# Patient Record
Sex: Female | Born: 1960 | Race: White | Hispanic: No | Marital: Married | Smoking: Former smoker
Health system: Southern US, Community
[De-identification: ages and names within clinical notes are randomized; demographics above are authoritative.]

## PROBLEM LIST (undated history)

## (undated) DIAGNOSIS — F419 Anxiety disorder, unspecified: Secondary | ICD-10-CM

## (undated) DIAGNOSIS — M797 Fibromyalgia: Secondary | ICD-10-CM

## (undated) DIAGNOSIS — I700 Atherosclerosis of aorta: Secondary | ICD-10-CM

## (undated) DIAGNOSIS — E785 Hyperlipidemia, unspecified: Secondary | ICD-10-CM

## (undated) DIAGNOSIS — M1990 Unspecified osteoarthritis, unspecified site: Secondary | ICD-10-CM

## (undated) DIAGNOSIS — K219 Gastro-esophageal reflux disease without esophagitis: Secondary | ICD-10-CM

## (undated) DIAGNOSIS — K589 Irritable bowel syndrome without diarrhea: Secondary | ICD-10-CM

## (undated) DIAGNOSIS — J439 Emphysema, unspecified: Secondary | ICD-10-CM

## (undated) DIAGNOSIS — G4733 Obstructive sleep apnea (adult) (pediatric): Secondary | ICD-10-CM

## (undated) DIAGNOSIS — N3010 Interstitial cystitis (chronic) without hematuria: Secondary | ICD-10-CM

## (undated) DIAGNOSIS — C801 Malignant (primary) neoplasm, unspecified: Secondary | ICD-10-CM

## (undated) DIAGNOSIS — F32A Depression, unspecified: Secondary | ICD-10-CM

## (undated) DIAGNOSIS — N809 Endometriosis, unspecified: Secondary | ICD-10-CM

## (undated) DIAGNOSIS — I10 Essential (primary) hypertension: Secondary | ICD-10-CM

## (undated) DIAGNOSIS — M5090 Cervical disc disorder, unspecified, unspecified cervical region: Secondary | ICD-10-CM

## (undated) HISTORY — PX: TONSILLECTOMY: SUR1361

## (undated) HISTORY — PX: ABDOMINOPLASTY: SUR9

## (undated) HISTORY — PX: OOPHORECTOMY: SHX86

## (undated) HISTORY — PX: ABDOMINAL HYSTERECTOMY: SHX81

## (undated) HISTORY — PX: CERVICAL FUSION: SHX112

## (undated) HISTORY — PX: CHOLECYSTECTOMY: SHX55

## (undated) HISTORY — PX: LYSIS OF ADHESION: SHX5961

## (undated) HISTORY — PX: CARPAL TUNNEL RELEASE: SHX101

---
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
## 2004-02-14 ENCOUNTER — Encounter: Admission: RE | Admit: 2004-02-14 | Discharge: 2004-02-14 | Payer: Self-pay | Admitting: Neurosurgery

## 2004-08-13 ENCOUNTER — Ambulatory Visit: Payer: Self-pay | Admitting: Pain Medicine

## 2004-09-10 ENCOUNTER — Ambulatory Visit: Payer: Self-pay | Admitting: Family Medicine

## 2004-09-23 ENCOUNTER — Ambulatory Visit: Payer: Self-pay | Admitting: Pain Medicine

## 2004-10-27 ENCOUNTER — Ambulatory Visit: Payer: Self-pay | Admitting: Pain Medicine

## 2004-12-23 ENCOUNTER — Ambulatory Visit: Payer: Self-pay | Admitting: Pain Medicine

## 2005-02-17 ENCOUNTER — Ambulatory Visit: Payer: Self-pay | Admitting: Pain Medicine

## 2005-02-25 ENCOUNTER — Ambulatory Visit: Payer: Self-pay | Admitting: Pain Medicine

## 2005-03-26 ENCOUNTER — Ambulatory Visit: Payer: Self-pay | Admitting: Pain Medicine

## 2005-05-06 ENCOUNTER — Ambulatory Visit: Payer: Self-pay | Admitting: Pain Medicine

## 2005-05-07 ENCOUNTER — Ambulatory Visit: Payer: Self-pay | Admitting: Family Medicine

## 2005-10-22 ENCOUNTER — Ambulatory Visit: Payer: Self-pay | Admitting: Pain Medicine

## 2005-10-26 ENCOUNTER — Ambulatory Visit: Payer: Self-pay | Admitting: Urology

## 2005-11-02 ENCOUNTER — Emergency Department: Payer: Self-pay | Admitting: Unknown Physician Specialty

## 2005-11-04 ENCOUNTER — Ambulatory Visit: Payer: Self-pay | Admitting: Pain Medicine

## 2005-12-10 ENCOUNTER — Ambulatory Visit: Payer: Self-pay | Admitting: Pain Medicine

## 2006-01-19 ENCOUNTER — Ambulatory Visit: Payer: Self-pay | Admitting: Pain Medicine

## 2006-02-23 ENCOUNTER — Ambulatory Visit: Payer: Self-pay | Admitting: Pain Medicine

## 2006-04-22 ENCOUNTER — Ambulatory Visit: Payer: Self-pay | Admitting: Pain Medicine

## 2006-05-05 ENCOUNTER — Ambulatory Visit: Payer: Self-pay | Admitting: Pain Medicine

## 2006-05-17 ENCOUNTER — Ambulatory Visit: Payer: Self-pay | Admitting: Family Medicine

## 2006-06-08 ENCOUNTER — Ambulatory Visit: Payer: Self-pay | Admitting: Pain Medicine

## 2006-08-24 ENCOUNTER — Ambulatory Visit: Payer: Self-pay | Admitting: Pain Medicine

## 2006-11-16 ENCOUNTER — Ambulatory Visit: Payer: Self-pay | Admitting: Pain Medicine

## 2006-12-01 ENCOUNTER — Ambulatory Visit: Payer: Self-pay | Admitting: Pain Medicine

## 2007-01-06 ENCOUNTER — Ambulatory Visit: Payer: Self-pay | Admitting: Pain Medicine

## 2007-05-03 ENCOUNTER — Ambulatory Visit: Payer: Self-pay | Admitting: Pain Medicine

## 2007-05-18 ENCOUNTER — Ambulatory Visit: Payer: Self-pay | Admitting: Pain Medicine

## 2007-06-29 ENCOUNTER — Ambulatory Visit: Payer: Self-pay | Admitting: Pain Medicine

## 2007-08-02 ENCOUNTER — Ambulatory Visit: Payer: Self-pay | Admitting: Pain Medicine

## 2007-08-10 ENCOUNTER — Ambulatory Visit: Payer: Self-pay | Admitting: Pain Medicine

## 2007-09-01 ENCOUNTER — Ambulatory Visit: Payer: Self-pay | Admitting: Family Medicine

## 2007-09-06 ENCOUNTER — Ambulatory Visit: Payer: Self-pay | Admitting: Pain Medicine

## 2007-10-17 ENCOUNTER — Ambulatory Visit: Payer: Self-pay | Admitting: Pain Medicine

## 2007-11-29 ENCOUNTER — Ambulatory Visit: Payer: Self-pay | Admitting: Pain Medicine

## 2008-05-08 ENCOUNTER — Ambulatory Visit: Payer: Self-pay | Admitting: Pain Medicine

## 2008-05-23 ENCOUNTER — Ambulatory Visit: Payer: Self-pay | Admitting: Pain Medicine

## 2008-06-26 ENCOUNTER — Ambulatory Visit: Payer: Self-pay | Admitting: Pain Medicine

## 2008-09-03 ENCOUNTER — Ambulatory Visit: Payer: Self-pay | Admitting: Family Medicine

## 2008-09-04 ENCOUNTER — Ambulatory Visit: Payer: Self-pay | Admitting: Pain Medicine

## 2008-10-11 ENCOUNTER — Ambulatory Visit: Payer: Self-pay | Admitting: Surgery

## 2008-10-30 ENCOUNTER — Ambulatory Visit: Payer: Self-pay | Admitting: Pain Medicine

## 2008-12-04 ENCOUNTER — Ambulatory Visit: Payer: Self-pay | Admitting: Pain Medicine

## 2009-01-01 ENCOUNTER — Ambulatory Visit: Payer: Self-pay | Admitting: Pain Medicine

## 2009-01-29 ENCOUNTER — Ambulatory Visit: Payer: Self-pay | Admitting: Pain Medicine

## 2009-03-05 ENCOUNTER — Ambulatory Visit: Payer: Self-pay | Admitting: Pain Medicine

## 2009-03-11 ENCOUNTER — Ambulatory Visit: Payer: Self-pay | Admitting: Pain Medicine

## 2009-04-02 ENCOUNTER — Ambulatory Visit: Payer: Self-pay | Admitting: Pain Medicine

## 2009-05-02 ENCOUNTER — Ambulatory Visit: Payer: Self-pay | Admitting: Pain Medicine

## 2009-06-05 ENCOUNTER — Ambulatory Visit: Payer: Self-pay | Admitting: Pain Medicine

## 2009-07-04 ENCOUNTER — Ambulatory Visit: Payer: Self-pay | Admitting: Pain Medicine

## 2009-07-31 ENCOUNTER — Ambulatory Visit: Payer: Self-pay | Admitting: Pain Medicine

## 2009-08-05 ENCOUNTER — Ambulatory Visit: Payer: Self-pay | Admitting: Pain Medicine

## 2009-09-16 ENCOUNTER — Ambulatory Visit: Payer: Self-pay | Admitting: Family Medicine

## 2009-09-23 ENCOUNTER — Ambulatory Visit: Payer: Self-pay | Admitting: Pain Medicine

## 2009-10-22 ENCOUNTER — Ambulatory Visit: Payer: Self-pay | Admitting: Pain Medicine

## 2009-11-20 ENCOUNTER — Ambulatory Visit: Payer: Self-pay | Admitting: Pain Medicine

## 2010-05-06 ENCOUNTER — Ambulatory Visit: Payer: Self-pay | Admitting: Pain Medicine

## 2010-06-03 ENCOUNTER — Ambulatory Visit: Payer: Self-pay | Admitting: Pain Medicine

## 2010-07-03 ENCOUNTER — Ambulatory Visit: Payer: Self-pay | Admitting: Pain Medicine

## 2010-08-04 ENCOUNTER — Ambulatory Visit: Payer: Self-pay | Admitting: Pain Medicine

## 2010-09-25 ENCOUNTER — Ambulatory Visit: Payer: Self-pay | Admitting: Pain Medicine

## 2010-10-30 ENCOUNTER — Ambulatory Visit: Payer: Self-pay | Admitting: Pain Medicine

## 2010-11-18 ENCOUNTER — Ambulatory Visit: Payer: Self-pay | Admitting: Pain Medicine

## 2010-12-02 ENCOUNTER — Ambulatory Visit: Payer: Self-pay | Admitting: Family Medicine

## 2010-12-04 ENCOUNTER — Ambulatory Visit: Payer: Self-pay | Admitting: Family Medicine

## 2010-12-16 ENCOUNTER — Ambulatory Visit: Payer: Self-pay | Admitting: Pain Medicine

## 2011-01-13 ENCOUNTER — Ambulatory Visit: Payer: Self-pay | Admitting: Pain Medicine

## 2011-02-11 ENCOUNTER — Ambulatory Visit: Payer: Self-pay | Admitting: Pain Medicine

## 2011-03-10 ENCOUNTER — Ambulatory Visit: Payer: Self-pay | Admitting: Pain Medicine

## 2011-04-14 ENCOUNTER — Ambulatory Visit: Payer: Self-pay | Admitting: Pain Medicine

## 2011-04-20 ENCOUNTER — Ambulatory Visit: Payer: Self-pay | Admitting: Pain Medicine

## 2011-05-06 ENCOUNTER — Ambulatory Visit: Payer: Self-pay | Admitting: Pain Medicine

## 2011-06-03 ENCOUNTER — Ambulatory Visit: Payer: Self-pay | Admitting: Pain Medicine

## 2011-06-24 ENCOUNTER — Ambulatory Visit: Payer: Self-pay | Admitting: Pain Medicine

## 2011-07-01 ENCOUNTER — Ambulatory Visit: Payer: Self-pay | Admitting: Pain Medicine

## 2011-08-04 ENCOUNTER — Ambulatory Visit: Payer: Self-pay | Admitting: Pain Medicine

## 2011-08-12 ENCOUNTER — Ambulatory Visit: Payer: Self-pay | Admitting: Pain Medicine

## 2011-09-08 ENCOUNTER — Ambulatory Visit: Payer: Self-pay | Admitting: Pain Medicine

## 2011-10-06 ENCOUNTER — Ambulatory Visit: Payer: Self-pay | Admitting: Pain Medicine

## 2011-10-27 ENCOUNTER — Ambulatory Visit: Payer: Self-pay | Admitting: Pain Medicine

## 2011-12-01 ENCOUNTER — Ambulatory Visit: Payer: Self-pay | Admitting: Pain Medicine

## 2011-12-07 ENCOUNTER — Ambulatory Visit: Payer: Self-pay | Admitting: Pain Medicine

## 2012-01-21 ENCOUNTER — Ambulatory Visit: Payer: Self-pay | Admitting: Pain Medicine

## 2012-03-01 ENCOUNTER — Ambulatory Visit: Payer: Self-pay | Admitting: Pain Medicine

## 2012-03-15 ENCOUNTER — Ambulatory Visit: Payer: Self-pay | Admitting: Family Medicine

## 2012-03-31 ENCOUNTER — Ambulatory Visit: Payer: Self-pay | Admitting: Pain Medicine

## 2012-04-28 ENCOUNTER — Ambulatory Visit: Payer: Self-pay | Admitting: Pain Medicine

## 2012-05-23 ENCOUNTER — Ambulatory Visit: Payer: Self-pay | Admitting: Pain Medicine

## 2012-06-28 ENCOUNTER — Ambulatory Visit: Payer: Self-pay | Admitting: Pain Medicine

## 2012-07-06 ENCOUNTER — Ambulatory Visit: Payer: Self-pay | Admitting: Pain Medicine

## 2012-07-28 ENCOUNTER — Ambulatory Visit: Payer: Self-pay | Admitting: Pain Medicine

## 2012-08-30 ENCOUNTER — Ambulatory Visit: Payer: Self-pay | Admitting: Pain Medicine

## 2012-09-29 ENCOUNTER — Ambulatory Visit: Payer: Self-pay | Admitting: Pain Medicine

## 2012-11-08 ENCOUNTER — Ambulatory Visit: Payer: Self-pay | Admitting: Pain Medicine

## 2013-01-03 ENCOUNTER — Ambulatory Visit: Payer: Self-pay | Admitting: Pain Medicine

## 2013-01-31 ENCOUNTER — Ambulatory Visit: Payer: Self-pay | Admitting: Pain Medicine

## 2013-02-13 ENCOUNTER — Ambulatory Visit: Payer: Self-pay | Admitting: Pain Medicine

## 2013-03-16 ENCOUNTER — Ambulatory Visit: Payer: Self-pay | Admitting: Family Medicine

## 2013-04-04 ENCOUNTER — Ambulatory Visit: Payer: Self-pay | Admitting: Pain Medicine

## 2013-04-12 ENCOUNTER — Ambulatory Visit: Payer: Self-pay | Admitting: Pain Medicine

## 2013-05-02 ENCOUNTER — Ambulatory Visit: Payer: Self-pay | Admitting: Pain Medicine

## 2013-06-01 ENCOUNTER — Ambulatory Visit: Payer: Self-pay | Admitting: Pain Medicine

## 2013-06-08 ENCOUNTER — Ambulatory Visit: Payer: Self-pay | Admitting: Pain Medicine

## 2013-06-29 ENCOUNTER — Ambulatory Visit: Payer: Self-pay | Admitting: Pain Medicine

## 2013-08-07 ENCOUNTER — Ambulatory Visit: Payer: Self-pay | Admitting: Pain Medicine

## 2013-09-05 ENCOUNTER — Ambulatory Visit: Payer: Self-pay | Admitting: Pain Medicine

## 2013-10-03 ENCOUNTER — Ambulatory Visit: Payer: Self-pay | Admitting: Pain Medicine

## 2013-11-07 ENCOUNTER — Ambulatory Visit: Payer: Self-pay | Admitting: Pain Medicine

## 2013-12-14 ENCOUNTER — Ambulatory Visit: Payer: Self-pay | Admitting: Pain Medicine

## 2014-01-08 ENCOUNTER — Ambulatory Visit: Payer: Self-pay | Admitting: Unknown Physician Specialty

## 2014-01-08 HISTORY — PX: COLONOSCOPY: SHX174

## 2014-01-10 LAB — PATHOLOGY REPORT

## 2014-01-16 ENCOUNTER — Ambulatory Visit: Payer: Self-pay | Admitting: Pain Medicine

## 2014-04-26 ENCOUNTER — Ambulatory Visit: Payer: Self-pay | Admitting: Family Medicine

## 2014-05-03 ENCOUNTER — Ambulatory Visit: Payer: Self-pay | Admitting: Pain Medicine

## 2016-06-03 ENCOUNTER — Ambulatory Visit: Payer: Medicare Other | Attending: Internal Medicine

## 2016-06-03 DIAGNOSIS — G4733 Obstructive sleep apnea (adult) (pediatric): Secondary | ICD-10-CM | POA: Diagnosis not present

## 2016-07-01 ENCOUNTER — Other Ambulatory Visit: Payer: Self-pay | Admitting: Family Medicine

## 2016-07-01 DIAGNOSIS — Z1231 Encounter for screening mammogram for malignant neoplasm of breast: Secondary | ICD-10-CM

## 2016-07-28 ENCOUNTER — Ambulatory Visit: Admission: RE | Admit: 2016-07-28 | Payer: Self-pay | Source: Ambulatory Visit

## 2016-07-28 ENCOUNTER — Other Ambulatory Visit: Payer: Self-pay | Admitting: Family Medicine

## 2016-07-28 DIAGNOSIS — R140 Abdominal distension (gaseous): Secondary | ICD-10-CM

## 2016-07-30 ENCOUNTER — Ambulatory Visit: Admission: RE | Admit: 2016-07-30 | Payer: Medicare Other | Source: Ambulatory Visit

## 2016-08-13 ENCOUNTER — Other Ambulatory Visit: Payer: Self-pay | Admitting: Nurse Practitioner

## 2016-08-13 DIAGNOSIS — K591 Functional diarrhea: Secondary | ICD-10-CM

## 2016-08-13 DIAGNOSIS — R140 Abdominal distension (gaseous): Secondary | ICD-10-CM

## 2016-08-14 ENCOUNTER — Other Ambulatory Visit
Admission: RE | Admit: 2016-08-14 | Discharge: 2016-08-14 | Disposition: A | Discharge status: Home / Self Care | Payer: Medicare Other | Source: Ambulatory Visit | Attending: Nurse Practitioner | Admitting: Nurse Practitioner

## 2016-08-14 DIAGNOSIS — K591 Functional diarrhea: Secondary | ICD-10-CM | POA: Diagnosis present

## 2016-08-14 LAB — GASTROINTESTINAL PANEL BY PCR, STOOL (REPLACES STOOL CULTURE)

## 2016-08-14 LAB — C DIFFICILE QUICK SCREEN W PCR REFLEX
C Diff antigen: NEGATIVE
C Diff interpretation: NOT DETECTED
C Diff toxin: NEGATIVE

## 2016-08-17 LAB — PANCREATIC ELASTASE, FECAL

## 2016-08-18 ENCOUNTER — Other Ambulatory Visit: Payer: Self-pay | Admitting: Family Medicine

## 2016-08-18 ENCOUNTER — Ambulatory Visit
Admission: RE | Admit: 2016-08-18 | Discharge: 2016-08-18 | Disposition: A | Discharge status: Home / Self Care | Payer: Medicare Other | Source: Ambulatory Visit | Attending: Family Medicine | Admitting: Family Medicine

## 2016-08-18 DIAGNOSIS — Z1231 Encounter for screening mammogram for malignant neoplasm of breast: Secondary | ICD-10-CM

## 2016-08-18 HISTORY — DX: Malignant (primary) neoplasm, unspecified: C80.1

## 2016-08-19 ENCOUNTER — Ambulatory Visit: Payer: Medicare Other

## 2017-09-20 ENCOUNTER — Other Ambulatory Visit: Payer: Self-pay | Admitting: Family Medicine

## 2017-09-20 DIAGNOSIS — Z1231 Encounter for screening mammogram for malignant neoplasm of breast: Secondary | ICD-10-CM

## 2017-10-12 ENCOUNTER — Ambulatory Visit
Admission: RE | Admit: 2017-10-12 | Discharge: 2017-10-12 | Disposition: A | Discharge status: Home / Self Care | Payer: Medicare Other | Source: Ambulatory Visit | Attending: Family Medicine | Admitting: Family Medicine

## 2017-10-12 DIAGNOSIS — Z1231 Encounter for screening mammogram for malignant neoplasm of breast: Secondary | ICD-10-CM | POA: Insufficient documentation

## 2017-12-13 HISTORY — PX: CARPAL TUNNEL RELEASE: SHX101

## 2018-04-25 DIAGNOSIS — C4491 Basal cell carcinoma of skin, unspecified: Secondary | ICD-10-CM

## 2018-04-25 HISTORY — DX: Basal cell carcinoma of skin, unspecified: C44.91

## 2018-06-15 ENCOUNTER — Other Ambulatory Visit
Admission: RE | Admit: 2018-06-15 | Discharge: 2018-06-15 | Disposition: A | Discharge status: Home / Self Care | Payer: Medicare Other | Source: Other Acute Inpatient Hospital | Attending: Nurse Practitioner | Admitting: Nurse Practitioner

## 2018-06-15 DIAGNOSIS — R197 Diarrhea, unspecified: Secondary | ICD-10-CM | POA: Insufficient documentation

## 2018-06-15 LAB — GASTROINTESTINAL PANEL BY PCR, STOOL (REPLACES STOOL CULTURE)

## 2018-06-15 LAB — LACTOFERRIN, FECAL, QUALITATIVE: Lactoferrin, Fecal, Qual: NEGATIVE

## 2018-06-15 LAB — C DIFFICILE QUICK SCREEN W PCR REFLEX
C Diff antigen: NEGATIVE
C Diff interpretation: NOT DETECTED
C Diff toxin: NEGATIVE

## 2018-09-27 ENCOUNTER — Other Ambulatory Visit: Payer: Self-pay | Admitting: Family Medicine

## 2018-09-27 DIAGNOSIS — Z1231 Encounter for screening mammogram for malignant neoplasm of breast: Secondary | ICD-10-CM

## 2018-10-18 ENCOUNTER — Ambulatory Visit
Admission: RE | Admit: 2018-10-18 | Discharge: 2018-10-18 | Disposition: A | Discharge status: Home / Self Care | Payer: Medicare Other | Source: Ambulatory Visit | Attending: Family Medicine | Admitting: Family Medicine

## 2018-10-18 DIAGNOSIS — Z1231 Encounter for screening mammogram for malignant neoplasm of breast: Secondary | ICD-10-CM

## 2019-11-15 HISTORY — PX: CARPAL TUNNEL RELEASE: SHX101

## 2019-11-30 ENCOUNTER — Other Ambulatory Visit: Payer: Self-pay | Admitting: Family Medicine

## 2019-11-30 DIAGNOSIS — Z1231 Encounter for screening mammogram for malignant neoplasm of breast: Secondary | ICD-10-CM

## 2019-12-07 ENCOUNTER — Other Ambulatory Visit: Payer: Self-pay

## 2019-12-07 ENCOUNTER — Ambulatory Visit
Admission: RE | Admit: 2019-12-07 | Discharge: 2019-12-07 | Disposition: A | Discharge status: Home / Self Care | Payer: Medicare Other | Source: Ambulatory Visit | Attending: Family Medicine | Admitting: Family Medicine

## 2019-12-07 DIAGNOSIS — Z1231 Encounter for screening mammogram for malignant neoplasm of breast: Secondary | ICD-10-CM | POA: Diagnosis not present

## 2020-02-26 ENCOUNTER — Telehealth: Payer: Self-pay | Admitting: *Deleted

## 2020-02-26 NOTE — Telephone Encounter (Signed)
Received referral for low dose lung cancer screening CT scan. Message left at phone number listed in EMR for patient to call me back to facilitate scheduling scan.  

## 2020-03-01 ENCOUNTER — Telehealth: Payer: Self-pay | Admitting: *Deleted

## 2020-03-01 DIAGNOSIS — Z87891 Personal history of nicotine dependence: Secondary | ICD-10-CM

## 2020-03-01 NOTE — Telephone Encounter (Signed)
Received referral for initial lung cancer screening scan. Contacted patient and obtained smoking history,(30) as well as answering questions related to screening process. Patient denies signs of lung cancer such as weight loss or hemoptysis. Patient denies comorbidity that would prevent curative treatment if lung cancer were found. Patient is scheduled for shared decision making visit and CT scan

## 2020-03-05 ENCOUNTER — Encounter: Payer: Self-pay | Admitting: Oncology

## 2020-03-05 ENCOUNTER — Inpatient Hospital Stay: Payer: Medicare Other | Attending: Oncology | Admitting: Oncology

## 2020-03-05 DIAGNOSIS — Z122 Encounter for screening for malignant neoplasm of respiratory organs: Secondary | ICD-10-CM

## 2020-03-05 DIAGNOSIS — Z87891 Personal history of nicotine dependence: Secondary | ICD-10-CM

## 2020-03-05 NOTE — Progress Notes (Signed)
Virtual Visit via Video Note  I connected with Alice Le on 03/05/20 at  1:00 PM EDT by a video enabled telemedicine application and verified that I am speaking with the correct person using two identifiers.  Location: Patient: Home Provider: Office   I discussed the limitations of evaluation and management by telemedicine and the availability of in person appointments. The patient expressed understanding and agreed to proceed.  I discussed the assessment and treatment plan with the patient. The patient was provided an opportunity to ask questions and all were answered. The patient agreed with the plan and demonstrated an understanding of the instructions.   The patient was advised to call back or seek an in-person evaluation if the symptoms worsen or if the condition fails to improve as anticipated.   In accordance with CMS guidelines, patient has met eligibility criteria including age, absence of signs or symptoms of lung cancer.  Social History   Tobacco Use  . Smoking status: Current Every Day Smoker    Packs/day: 0.75    Years: 40.00    Pack years: 30.00    Types: Cigarettes  Substance Use Topics  . Alcohol use: Not on file  . Drug use: Not on file      A shared decision-making session was conducted prior to the performance of CT scan. This includes one or more decision aids, includes benefits and harms of screening, follow-up diagnostic testing, over-diagnosis, false positive rate, and total radiation exposure.   Counseling on the importance of adherence to annual lung cancer LDCT screening, impact of co-morbidities, and ability or willingness to undergo diagnosis and treatment is imperative for compliance of the program.   Counseling on the importance of continued smoking cessation for former smokers; the importance of smoking cessation for current smokers, and information about tobacco cessation interventions have been given to patient including [place] and  [employer] quit [place] programs.   Written order for lung cancer screening with LDCT has been given to the patient and any and all questions have been answered to the best of my abilities.    Yearly follow up will be coordinated by Burgess Estelle, Thoracic Navigator.  I provided 15 minutes of face-to-face video visit time during this encounter, and > 50% was spent counseling as documented under my assessment & plan.   Jacquelin Hawking, NP

## 2020-03-06 ENCOUNTER — Telehealth: Payer: Medicare Other | Admitting: Oncology

## 2020-03-06 ENCOUNTER — Ambulatory Visit
Admission: RE | Admit: 2020-03-06 | Discharge: 2020-03-06 | Disposition: A | Discharge status: Home / Self Care | Payer: Medicare Other | Source: Ambulatory Visit | Attending: Oncology | Admitting: Oncology

## 2020-03-06 ENCOUNTER — Other Ambulatory Visit: Payer: Self-pay

## 2020-03-06 DIAGNOSIS — Z87891 Personal history of nicotine dependence: Secondary | ICD-10-CM | POA: Insufficient documentation

## 2020-03-08 ENCOUNTER — Encounter: Payer: Self-pay | Admitting: *Deleted

## 2020-05-01 ENCOUNTER — Encounter: Payer: Self-pay | Admitting: Dermatology

## 2020-05-01 ENCOUNTER — Ambulatory Visit: Payer: Medicare Other | Admitting: Dermatology

## 2020-05-01 ENCOUNTER — Other Ambulatory Visit: Payer: Self-pay

## 2020-05-01 DIAGNOSIS — L219 Seborrheic dermatitis, unspecified: Secondary | ICD-10-CM

## 2020-05-01 DIAGNOSIS — L820 Inflamed seborrheic keratosis: Secondary | ICD-10-CM | POA: Diagnosis not present

## 2020-05-01 DIAGNOSIS — L821 Other seborrheic keratosis: Secondary | ICD-10-CM | POA: Diagnosis not present

## 2020-05-01 DIAGNOSIS — Z85828 Personal history of other malignant neoplasm of skin: Secondary | ICD-10-CM

## 2020-05-01 DIAGNOSIS — I8310 Varicose veins of unspecified lower extremity with inflammation: Secondary | ICD-10-CM | POA: Diagnosis not present

## 2020-05-01 MED ORDER — CLOBETASOL PROPIONATE 0.05 % EX SHAM: MEDICATED_SHAMPOO | CUTANEOUS | 3 refills | Status: DC

## 2020-05-01 MED ORDER — KETOCONAZOLE 2 % EX SHAM: MEDICATED_SHAMPOO | CUTANEOUS | 3 refills | Status: DC

## 2020-05-01 NOTE — Progress Notes (Signed)
   Follow-Up Visit   Subjective  Alice Le is a 59 y.o. female who presents for the following: lesion (L temple - growing in size ), dry scalp (has tried numerous OTC dandruff shampoos and scalp treatments but nothing helps), lesion (irregular appearing, scaly on the L side ), and veins (patient would like to discuss treatment options).  The following portions of the chart were reviewed this encounter and updated as appropriate:  Allergies  Meds  Problems  Med Hx  Surg Hx  Fam Hx     Review of Systems:  No other skin or systemic complaints except as noted in HPI or Assessment and Plan.  Objective  Well appearing patient in no apparent distress; mood and affect are within normal limits.  A focused examination was performed including the scalp, trunk, face, and extremities. Relevant physical exam findings are noted in the Assessment and Plan.  Objective  L temple x 1, L side x 1, R shoulder x 1 (3): Erythematous keratotic or waxy stuck-on papule or plaque.   Objective  scalp: Pink patches with greasy scale.    Assessment & Plan    Inflamed seborrheic keratosis (3) L temple x 1, L side x 1, R shoulder x 1  Destruction of lesion - L temple x 1, L side x 1, R shoulder x 1 Complexity: simple   Destruction method: cryotherapy   Informed consent: discussed and consent obtained   Timeout:  patient name, date of birth, surgical site, and procedure verified Lesion destroyed using liquid nitrogen: Yes   Region frozen until ice ball extended beyond lesion: Yes   Outcome: patient tolerated procedure well with no complications   Post-procedure details: wound care instructions given    Seborrheic dermatitis scalp  Vs psoriasis vs sebopsoriasis -  Start Ketoconazole 2% shampoo let sit 10 minutes before washing out. Use 2-3 days per week. Alternate with Clobetasol shampoo 2-3 days per week.   ketoconazole (NIZORAL) 2 % shampoo - scalp  Clobetasol Propionate (CLOBEX) 0.05 %  shampoo - scalp  Varicose Veins - Dilated blue, purple or red veins at the lower extremities - Reassured - These can be treated by sclerotherapy (a procedure to inject a medicine into the veins to make them disappear) if desired, but the treatment is not covered by insurance  Seborrheic Keratoses - Stuck-on, waxy, tan-brown papules and plaques  - Discussed benign etiology and prognosis. - Observe - Call for any changes  History of Basal Cell Carcinoma of the Skin - No evidence of recurrence today - Recommend regular full body skin exams - Recommend daily broad spectrum sunscreen SPF 30+ to sun-exposed areas, reapply every 2 hours as needed.  - Call if any new or changing lesions are noted between office visits  Return in about 3 months (around 08/01/2020) for TBSE.  Luther Redo, CMA, am acting as scribe for Sarina Ser, MD .  Documentation: I have reviewed the above documentation for accuracy and completeness, and I agree with the above.  Sarina Ser, MD

## 2020-05-07 ENCOUNTER — Encounter: Payer: Self-pay | Admitting: Dermatology

## 2020-05-20 ENCOUNTER — Telehealth: Payer: Self-pay

## 2020-05-20 MED ORDER — FLUOCINONIDE 0.05 % EX SOLN: 1.0000 "application " | CUTANEOUS | 3 refills | Status: DC

## 2020-05-20 NOTE — Telephone Encounter (Signed)
Patient Clobetasol Shampoo was denied. She was able to pick up her Ketoconazole Shampoo. She did discuss Fluocinolone Scalp Oil with a friend and she was just wanting to know would this be something that would help her?

## 2020-05-20 NOTE — Telephone Encounter (Signed)
I would go with Fluocinonide solution (Not Fluocinolone) apply at bedtime to scalp - leave on overnight - wash out in AM.  Use daily for 2 weeks, then decrease to 3-5 days per week as needed. Disp large trade with 6 rf.

## 2020-05-20 NOTE — Telephone Encounter (Signed)
Prescription has been sent in and patient has been advised.

## 2020-06-24 ENCOUNTER — Ambulatory Visit: Admit: 2020-06-24 | Disposition: A | Discharge status: Home / Self Care | Payer: Medicare Other

## 2020-06-25 ENCOUNTER — Ambulatory Visit (INDEPENDENT_AMBULATORY_CARE_PROVIDER_SITE_OTHER): Payer: Medicare Other

## 2020-06-25 ENCOUNTER — Other Ambulatory Visit: Payer: Self-pay

## 2020-06-25 ENCOUNTER — Ambulatory Visit
Admission: RE | Admit: 2020-06-25 | Discharge: 2020-06-25 | Disposition: A | Discharge status: Home / Self Care | Payer: Medicare Other | Source: Ambulatory Visit | Attending: Emergency Medicine | Admitting: Emergency Medicine

## 2020-06-25 VITALS — BP 171/86 | HR 61 | Temp 98.2°F | Resp 18 | Ht 61.0 in | Wt 160.0 lb

## 2020-06-25 DIAGNOSIS — J189 Pneumonia, unspecified organism: Secondary | ICD-10-CM

## 2020-06-25 DIAGNOSIS — J439 Emphysema, unspecified: Secondary | ICD-10-CM | POA: Diagnosis not present

## 2020-06-25 HISTORY — DX: Pneumonia, unspecified organism: J18.9

## 2020-06-25 HISTORY — DX: Fibromyalgia: M79.7

## 2020-06-25 HISTORY — DX: Interstitial cystitis (chronic) without hematuria: N30.10

## 2020-06-25 HISTORY — DX: Essential (primary) hypertension: I10

## 2020-06-25 HISTORY — DX: Gastro-esophageal reflux disease without esophagitis: K21.9

## 2020-06-25 MED ORDER — AMOXICILLIN-POT CLAVULANATE 875-125 MG PO TABS: 1.0000 | ORAL_TABLET | Freq: Two times a day (BID) | ORAL | 0 refills | Status: AC

## 2020-06-25 MED ORDER — ALBUTEROL SULFATE HFA 108 (90 BASE) MCG/ACT IN AERS: 2.0000 | INHALATION_SPRAY | RESPIRATORY_TRACT | 0 refills | Status: DC | PRN

## 2020-06-25 MED ORDER — AZITHROMYCIN 250 MG PO TABS: 250.0000 mg | ORAL_TABLET | Freq: Every day | ORAL | 0 refills | Status: DC

## 2020-06-25 MED ORDER — PREDNISONE 20 MG PO TABS: 40.0000 mg | ORAL_TABLET | Freq: Every day | ORAL | 0 refills | Status: AC

## 2020-06-25 MED ORDER — IBUPROFEN 600 MG PO TABS: 600.0000 mg | ORAL_TABLET | Freq: Four times a day (QID) | ORAL | 0 refills | Status: DC | PRN

## 2020-06-25 MED ORDER — AEROCHAMBER PLUS MISC: 2 refills | Status: DC

## 2020-06-25 NOTE — Discharge Instructions (Addendum)
2 puffs from your albuterol inhaler using your spacer every 4 hours for 2 days.  Then 2 puffs every 6 hours for 2 days, then you may use the albuterol as needed.  Finished prednisone and both of the antibiotics.  Get a pulse oximeter monitor your oxygen level.  If it goes below 90, go to the emergency department.  600 mg of ibuprofen combined with 1000 mg of Tylenol 3-4 times a day as needed for chest soreness.

## 2020-06-25 NOTE — ED Triage Notes (Signed)
Patient in today c/o cough, sob and chest tightness. Patient was diagnosed with covid 06/10/20 (symptoms started (06/08/20), but has continued to have these symptoms. Patient denies fever. Patient has not been taken any OTC meds for symptoms since ~1 week ago.

## 2020-06-25 NOTE — ED Provider Notes (Signed)
HPI  SUBJECTIVE:  Alice Le is a 59 y.o. female who presents with persistent, constant bilateral diffuse chest soreness, tightness, pain with deep inspiration.  She was diagnosed with Covid on 8/2.  Symptoms started on 7/31.  She states that the cough resolved 6 days ago.  It was productive of brown sputum.  She reports a sinus pressure for the past 3 days, no pain.  No nasal congestion, postnasal drip, rhinorrhea, nausea, vomiting fevers, wheezing, shortness of breath.  No antibiotics in the past few months.  No antipyretic in the past 6 hours.  She tried stretching, Vicks VapoRub, Tylenol.  Tylenol helps.  Symptoms worse with torso rotation, movement, stretching.  There is no exertional,  positional component to this.  She has never had symptoms like this before.  She has a past medical history of Covid, hypertension, emphysema, states that she quit smoking while sick with Covid.  No history of MI, hypercholesterolemia, diabetes, chronic kidney disease, cancer, HIV, immunocompromise, PE, DVT, lung cancer.  Her primary concern is that she has a secondary pneumonia.  [id number], Chrissie Noa, MD   Past Medical History:  Diagnosis Date  . Basal cell carcinoma 04/25/2018   R mid infraclavicular  . Cancer ([place])    skin ca  . Fibromyalgia   . GERD (gastroesophageal reflux disease)   . Hypertension   . Interstitial cystitis     Past Surgical History:  Procedure Laterality Date  . ABDOMINAL HYSTERECTOMY    . CARPAL TUNNEL RELEASE Bilateral   . CERVICAL FUSION    . CHOLECYSTECTOMY    . OOPHORECTOMY      Family History  Problem Relation Age of Onset  . Breast cancer Mother 62  . Bladder Cancer Mother   . Hypertension Mother   . Thyroid disease Mother   . Lung cancer Father   . Heart disease Father   . COPD Father     Social History   Tobacco Use  . Smoking status: Former Smoker    Packs/day: 0.75    Years: 40.00    Pack years: 30.00    Types: Cigarettes    Quit date:  06/10/2020    Years since quitting: 0.0  . Smokeless tobacco: Never Used  Vaping Use  . Vaping Use: Never used  Substance Use Topics  . Alcohol use: Yes    Comment: social  . Drug use: Never    No current facility-administered medications for this encounter.  Current Outpatient Medications:  .  amitriptyline (ELAVIL) 50 MG tablet, Take by mouth., Disp: , Rfl:  .  cetirizine (ZYRTEC) 10 MG tablet, , Disp: , Rfl:  .  Cholecalciferol (VITAMIN D3) 10 MCG (400 UNIT) CAPS, , Disp: , Rfl:  .  Clobetasol Propionate (CLOBEX) 0.05 % shampoo, Shampoo into scalp let sit 10 minutes then wash out. Use 2-3 days per week. Alternate with Ketoconazole shampoo., Disp: 118 mL, Rfl: 3 .  Coenzyme Q10 (COQ10) 200 MG CAPS, , Disp: , Rfl:  .  FLONASE ALLERGY RELIEF 50 MCG/ACT nasal spray, , Disp: , Rfl:  .  fluocinonide (LIDEX) 0.05 % external solution, Apply 1 application topically as directed. Apply at bedtime to scalp, leave on overnight, wash out in AM. Use daily for 2 weeks then decrease to 3-5 days per week PRN., Disp: 180 mL, Rfl: 3 .  gabapentin (NEURONTIN) 600 MG tablet, Take by mouth., Disp: , Rfl:  .  Krill Oil 1000 MG CAPS, Take 1 tablet by mouth daily., Disp: , Rfl:  .  Lactobacillus-Inulin ([place]) CAPS, Take by mouth., Disp: , Rfl:  .  lisinopril (ZESTRIL) 20 MG tablet, Take 1 tablet by mouth daily., Disp: , Rfl:  .  Meth-Hyo-M Bl-Na Phos-Ph Sal (URIBEL) 118 MG CAPS, Take by mouth., Disp: , Rfl:  .  Morphine-Naltrexone 100-4 MG CPCR, TAKE 1 CAPSULE BY MOUTH ONCE daily, Disp: , Rfl:  .  phentermine (ADIPEX-P) 37.5 MG tablet, Take by mouth., Disp: , Rfl:  .  RABEprazole (ACIPHEX) 20 MG tablet, Take 1 tablet by mouth daily., Disp: , Rfl:  .  tiZANidine (ZANAFLEX) 4 MG tablet, Take by mouth., Disp: , Rfl:  .  albuterol (VENTOLIN HFA) 108 (90 Base) MCG/ACT inhaler, Inhale 2 puffs into the lungs every 4 (four) hours as needed for wheezing or shortness of breath., Disp: 18 g, Rfl:  0 .  amoxicillin-clavulanate (AUGMENTIN) 875-125 MG tablet, Take 1 tablet by mouth 2 (two) times daily for 5 days., Disp: 10 tablet, Rfl: 0 .  azithromycin (ZITHROMAX) 250 MG tablet, Take 1 tablet (250 mg total) by mouth daily. 2 tabs po on day 1, 1 tab po on days 2-5, Disp: 6 tablet, Rfl: 0 .  ibuprofen (ADVIL) 600 MG tablet, Take 1 tablet (600 mg total) by mouth every 6 (six) hours as needed., Disp: 30 tablet, Rfl: 0 .  predniSONE (DELTASONE) 20 MG tablet, Take 2 tablets (40 mg total) by mouth daily with breakfast for 5 days., Disp: 10 tablet, Rfl: 0 .  Spacer/Aero-Holding Chambers (AEROCHAMBER PLUS) inhaler, Use as instructed, Disp: 1 each, Rfl: 2  No Known Allergies   ROS  As noted in HPI.   Physical Exam  BP (!) 171/86 (BP Location: Left Arm)   Pulse 61   Temp 98.2 F (36.8 C) (Oral)   Resp 18   Ht 5\' 1"  (1.549 m)   Wt 72.6 kg   SpO2 100%   BMI 30.23 kg/m   Constitutional: Well developed, well nourished, no acute distress Eyes:  EOMI, conjunctiva normal bilaterally HENT: Normocephalic, atraumatic,mucus membranes moist Respiratory: Normal inspiratory effort lungs clear bilaterally good air movement.  Positive diffuse anterior chest wall tenderness that reproduces her chest pain  cardiovascular: Normal rate regular rhythm no murmurs rubs or gallops GI: nondistended skin: No rash, skin intact Musculoskeletal: Calves symmetric, nontender, no edema no palpable cord Neurologic: Alert & oriented x 3, no focal neuro deficits Psychiatric: Speech and behavior appropriate   ED Course   Medications - No data to display  Orders Placed This Encounter  Procedures  . DG Chest 2 View    Standing Status:   Standing    Number of Occurrences:   1    Order Specific Question:   Reason for Exam (SYMPTOM  OR DIAGNOSIS REQUIRED)    Answer:   covid + 06/10/20, continued cough and chest tightness    Order Specific Question:   Is patient pregnant?    Answer:   No  . ED EKG    Standing  Status:   Standing    Number of Occurrences:   1    Order Specific Question:   Reason for Exam    Answer:   Chest Pain  . EKG 12-Lead    Standing Status:   Standing    Number of Occurrences:   1    No results found for this or any previous visit (from the past 24 hour(s)). DG Chest 2 View  Result Date: 06/25/2020 CLINICAL DATA:  COVID positive 06/10/2020. Persistent cough and chest tightness.  EXAM: CHEST - 2 VIEW COMPARISON:  CT chest 03/06/2020 FINDINGS: Heart size normal. Peripheral upper lobe airspace opacities present bilaterally, new from the prior study. No other significant airspace consolidation is present. No edema or effusion is present. IMPRESSION: New bilateral peripheral upper lobe airspace disease, compatible with pneumonia. Electronically Signed   By: San Morelle M.D.   On: 06/25/2020 09:49    ED Clinical Impression  1. Pneumonia of both upper lobes due to infectious organism   2. Pulmonary emphysema, unspecified emphysema type [facility])      ED Assessment/Plan  EKG: Sinus bradycardia, normal axis, normal intervals.  Early repolarization.  No ST-T wave changes.  No previous EKG for comparison.  Patient was symptomatic while EKG was obtained  Reviewed imaging independently.  Bilateral peripheral upper lobe airspace disease compatible with pneumonia.  see radiology report for full details.  EKG reassuring.  X-ray shows pneumonia.  Doubt PE or other cause of her chest pain.  Patient with a post Covid pneumonia.  Will send home with 40 mg of prednisone for 5 days, regularly scheduled albuterol for the next 4 days with a spacer, Ibuprofen/Tylenol, Augmentin plus azithromycin and comorbidity of emphysema.  Follow-up with PMD in several days, to the ER if she gets worse.  Discussed imaging, MDM, treatment plan, and plan for follow-up with patient. Discussed sn/sx that should prompt return to the ED. patient agrees with plan.   Meds ordered this encounter  Medications   . predniSONE (DELTASONE) 20 MG tablet    Sig: Take 2 tablets (40 mg total) by mouth daily with breakfast for 5 days.    Dispense:  10 tablet    Refill:  0  . albuterol (VENTOLIN HFA) 108 (90 Base) MCG/ACT inhaler    Sig: Inhale 2 puffs into the lungs every 4 (four) hours as needed for wheezing or shortness of breath.    Dispense:  18 g    Refill:  0  . Spacer/Aero-Holding Chambers (AEROCHAMBER PLUS) inhaler    Sig: Use as instructed    Dispense:  1 each    Refill:  2  . azithromycin (ZITHROMAX) 250 MG tablet    Sig: Take 1 tablet (250 mg total) by mouth daily. 2 tabs po on day 1, 1 tab po on days 2-5    Dispense:  6 tablet    Refill:  0  . amoxicillin-clavulanate (AUGMENTIN) 875-125 MG tablet    Sig: Take 1 tablet by mouth 2 (two) times daily for 5 days.    Dispense:  10 tablet    Refill:  0  . ibuprofen (ADVIL) 600 MG tablet    Sig: Take 1 tablet (600 mg total) by mouth every 6 (six) hours as needed.    Dispense:  30 tablet    Refill:  0    *This clinic note was created using [employer]. Therefore, there may be occasional mistakes despite careful proofreading.   ?    Melynda Ripple, MD 06/25/20 [phone]

## 2020-06-26 ENCOUNTER — Ambulatory Visit: Payer: Medicare Other | Admitting: Dermatology

## 2020-08-05 ENCOUNTER — Encounter: Payer: Self-pay | Admitting: Dermatology

## 2020-08-05 ENCOUNTER — Ambulatory Visit: Payer: Medicare Other | Admitting: Dermatology

## 2020-08-05 ENCOUNTER — Other Ambulatory Visit: Payer: Self-pay

## 2020-08-05 DIAGNOSIS — L570 Actinic keratosis: Secondary | ICD-10-CM

## 2020-08-05 DIAGNOSIS — Z85828 Personal history of other malignant neoplasm of skin: Secondary | ICD-10-CM | POA: Diagnosis not present

## 2020-08-05 DIAGNOSIS — D225 Melanocytic nevi of trunk: Secondary | ICD-10-CM

## 2020-08-05 DIAGNOSIS — L814 Other melanin hyperpigmentation: Secondary | ICD-10-CM

## 2020-08-05 DIAGNOSIS — L719 Rosacea, unspecified: Secondary | ICD-10-CM

## 2020-08-05 DIAGNOSIS — Z1283 Encounter for screening for malignant neoplasm of skin: Secondary | ICD-10-CM

## 2020-08-05 DIAGNOSIS — L820 Inflamed seborrheic keratosis: Secondary | ICD-10-CM | POA: Diagnosis not present

## 2020-08-05 DIAGNOSIS — D229 Melanocytic nevi, unspecified: Secondary | ICD-10-CM

## 2020-08-05 DIAGNOSIS — L578 Other skin changes due to chronic exposure to nonionizing radiation: Secondary | ICD-10-CM

## 2020-08-05 DIAGNOSIS — D1800 Hemangioma unspecified site: Secondary | ICD-10-CM

## 2020-08-05 DIAGNOSIS — I781 Nevus, non-neoplastic: Secondary | ICD-10-CM

## 2020-08-05 DIAGNOSIS — L821 Other seborrheic keratosis: Secondary | ICD-10-CM

## 2020-08-05 NOTE — Progress Notes (Signed)
Follow-Up Visit   Subjective  Alice Le is a 59 y.o. female who presents for the following: TBSE (Areas of concern Forehead, right lip, B/L eye, Under left breast, Right arm, Right axilla, Butt crack, Right side nose, and chin). The patient presents for Total-Body Skin Exam (TBSE) for skin cancer screening and mole check. Patient present today for Patient here for full body skin exam and skin cancer screening. Patient has h/o BCC R. Mid infraclavicular 04/25/18 with excision  The following portions of the chart were reviewed this encounter and updated as appropriate:  Tobacco  Allergies  Meds  Problems  Med Hx  Surg Hx  Fam Hx     Review of Systems:  No other skin or systemic complaints except as noted in HPI or Assessment and Plan.  Objective  Well appearing patient in no apparent distress; mood and affect are within normal limits.  A full examination was performed including scalp, head, eyes, ears, nose, lips, neck, chest, axillae, abdomen, back, buttocks, bilateral upper extremities, bilateral lower extremities, hands, feet, fingers, toes, fingernails, and toenails. All findings within normal limits unless otherwise noted below.  Objective  Right mid infraclavicular: Well healed scar with no evidence of recurrence.   Objective  Left Forehead, Left Inframammary x 10 (10), Left Temple, Right Bicep, Right Elbow, Right Forearm x 4 (4), Right Side x 2 (2): Erythematous keratotic or waxy stuck-on papule or plaque.   Objective  Right Mid Upper Lip at [facility] border, Right Nose: Erythematous thin papules/macules with gritty scale.   Objective  Mid Face: Mid face erythema with telangiectasias   Objective  Right  Eyelid x 4 (4), Superior Gluteal Crease: Flesh papule    Assessment & Plan  History of basal cell carcinoma (BCC) Right mid infraclavicular Clear. Observe for recurrence. Call clinic for new or changing lesions.  Recommend regular skin exams, daily  broad-spectrum spf 30+ sunscreen use, and photoprotection.     Inflamed seborrheic keratosis (20) Right Elbow; Right Bicep; Right Forearm x 4 (4); Left Inframammary x 10 (10); Right Side x 2 (2); Left Temple; Left Forehead Cryotherapy today Prior to procedure, discussed risks of blister formation, small wound, skin dyspigmentation, or rare scar following cryotherapy.   Destruction of lesion - Left Forehead, Left Inframammary x 10, Left Temple, Right Bicep, Right Elbow, Right Forearm x 4, Right Side x 2 Complexity: simple   Destruction method: cryotherapy   Informed consent: discussed and consent obtained   Timeout:  patient name, date of birth, surgical site, and procedure verified Lesion destroyed using liquid nitrogen: Yes   Region frozen until ice ball extended beyond lesion: Yes   Outcome: patient tolerated procedure well with no complications   Post-procedure details: wound care instructions given    The patient may decide to treat ISKs of the right upper eyelid in the future. She may make appointment.  AK (actinic keratosis) (2) Right Nose; Right Mid Upper Lip at [facility] border  Cryotherapy today Prior to procedure, discussed risks of blister formation, small wound, skin dyspigmentation, or rare scar following cryotherapy.    Destruction of lesion - Right Mid Upper Lip at [facility] border, Right Nose Complexity: simple   Destruction method: cryotherapy   Informed consent: discussed and consent obtained   Timeout:  patient name, date of birth, surgical site, and procedure verified Lesion destroyed using liquid nitrogen: Yes   Region frozen until ice ball extended beyond lesion: Yes   Outcome: patient tolerated procedure well with no complications  Post-procedure details: wound care instructions given    Rosacea Mid Face Recommend BBL/laser treatment. Patient has had this treatment in past.  Nevus  Superior Gluteal Crease Benign-appearing.  Observation.  Call clinic  for new or changing lesions.  Recommend daily use of broad spectrum spf 30+ sunscreen to sun-exposed areas.   Spider veins B/L leg Will have treatment at Upcoming appointment   Lentigines - Scattered tan macules - Discussed due to sun exposure - Benign, observe - Call for any changes  Seborrheic Keratoses - Stuck-on, waxy, tan-brown papules and plaques  - Discussed benign etiology and prognosis. - Observe - Call for any changes  Melanocytic Nevi - Tan-brown and/or pink-flesh-colored symmetric macules and papules - Benign appearing on exam today - Observation - Call clinic for new or changing moles - Recommend daily use of broad spectrum spf 30+ sunscreen to sun-exposed areas.   Hemangiomas - Red papules - Discussed benign nature - Observe - Call for any changes  Actinic Damage - diffuse scaly erythematous macules with underlying dyspigmentation - Recommend daily broad spectrum sunscreen SPF 30+ to sun-exposed areas, reapply every 2 hours as needed.  - Call for new or changing lesions.  Skin cancer screening performed today.  History of Basal Cell Carcinoma of the Skin - No evidence of recurrence today - Recommend regular full body skin exams - Recommend daily broad spectrum sunscreen SPF 30+ to sun-exposed areas, reapply every 2 hours as needed.  - Call if any new or changing lesions are noted between office visits  Return in about 1 year (around 08/05/2021) for TBSE.  I, Donzetta Kohut, CMA, am acting as scribe for Sarina Ser, MD . Documentation: I have reviewed the above documentation for accuracy and completeness, and I agree with the above.  Sarina Ser, MD

## 2020-08-05 NOTE — Patient Instructions (Addendum)

## 2020-08-21 ENCOUNTER — Other Ambulatory Visit: Payer: Self-pay

## 2020-08-21 ENCOUNTER — Ambulatory Visit (INDEPENDENT_AMBULATORY_CARE_PROVIDER_SITE_OTHER): Payer: Self-pay | Admitting: Dermatology

## 2020-08-21 DIAGNOSIS — I8393 Asymptomatic varicose veins of bilateral lower extremities: Secondary | ICD-10-CM

## 2020-08-21 NOTE — Progress Notes (Signed)
   Follow-Up Visit   Subjective  Alice Le is a 59 y.o. female who presents for the following: sclerotherapy (Pt here to have veins treated on her legs today,).  The following portions of the chart were reviewed this encounter and updated as appropriate:  Tobacco  Allergies  Meds  Problems  Med Hx  Surg Hx  Fam Hx     Review of Systems:  No other skin or systemic complaints except as noted in HPI or Assessment and Plan.  Objective  Well appearing patient in no apparent distress; mood and affect are within normal limits.  A focused examination was performed including legs . Relevant physical exam findings are noted in the Assessment and Plan.  Objective  Right lower leg: Spider veins  Images                     Assessment & Plan  Spider veins of both lower extremities Right lower leg  Intralesional injection - Right lower leg The patient presents for desired sclerotherapy for desired treatment of desired treatment of small to medium blue and red varicosities of the right lower leg.  Procedure: The patient was counseled and understands about the effects, side effects and potential risks and complications of the sclerotherapy procedure. The patient was given the opportunity to ask questions. Asclera (polidocanol) 1% (total 2 cc) was injected into the varices. In order to ensure correct placement of the catheter in the vein, I drew back slightly to give moderate blood show in the syringe. If there was any evidence or suspicion of extravasation of sclerosant, the area was immediately diluted with a large volume of 0.9% saline. A pressure dressing was applied immediately to the injected sites. The patient tolerated the procedure well without complication. The patient was instructed in post-operative compression stocking use. The patient understands to call or return immediately if any problems noted.   Return in about 3 months (around 11/21/2020).  IMarye Round, CMA, am acting as scribe for Sarina Ser, MD .  Documentation: I have reviewed the above documentation for accuracy and completeness, and I agree with the above.  Sarina Ser, MD

## 2020-08-22 ENCOUNTER — Encounter: Payer: Self-pay | Admitting: Dermatology

## 2020-11-04 ENCOUNTER — Ambulatory Visit
Admission: EM | Admit: 2020-11-04 | Discharge: 2020-11-04 | Disposition: A | Discharge status: Home / Self Care | Payer: Medicare Other | Attending: Family Medicine | Admitting: Family Medicine

## 2020-11-04 ENCOUNTER — Other Ambulatory Visit: Payer: Self-pay

## 2020-11-04 DIAGNOSIS — Z7951 Long term (current) use of inhaled steroids: Secondary | ICD-10-CM | POA: Diagnosis not present

## 2020-11-04 DIAGNOSIS — I10 Essential (primary) hypertension: Secondary | ICD-10-CM | POA: Diagnosis not present

## 2020-11-04 DIAGNOSIS — Z87891 Personal history of nicotine dependence: Secondary | ICD-10-CM | POA: Insufficient documentation

## 2020-11-04 DIAGNOSIS — Z20822 Contact with and (suspected) exposure to covid-19: Secondary | ICD-10-CM | POA: Diagnosis not present

## 2020-11-04 DIAGNOSIS — J441 Chronic obstructive pulmonary disease with (acute) exacerbation: Secondary | ICD-10-CM | POA: Insufficient documentation

## 2020-11-04 DIAGNOSIS — Z79899 Other long term (current) drug therapy: Secondary | ICD-10-CM | POA: Insufficient documentation

## 2020-11-04 LAB — RESP PANEL BY RT-PCR (FLU A&B, COVID) ARPGX2
Influenza A by PCR: NEGATIVE
Influenza B by PCR: NEGATIVE
SARS Coronavirus 2 by RT PCR: NEGATIVE

## 2020-11-04 MED ORDER — HYDROCOD POLST-CPM POLST ER 10-8 MG/5ML PO SUER: 5.0000 mL | Freq: Every evening | ORAL | 0 refills | Status: DC | PRN

## 2020-11-04 MED ORDER — PREDNISONE 50 MG PO TABS: ORAL_TABLET | ORAL | 0 refills | Status: DC

## 2020-11-04 MED ORDER — AZITHROMYCIN 250 MG PO TABS: ORAL_TABLET | ORAL | 0 refills | Status: DC

## 2020-11-04 NOTE — ED Provider Notes (Signed)
[facility]    CSN: [id number] Arrival date & time: 11/04/20  0944      History   Chief Complaint Chief Complaint  Patient presents with   Nasal Congestion   HPI  59 year old female presents with congestion and cough.  Symptoms started on Thursday.  Initially developed congestion.  Later developed cough.  She reports associated chest tightness.  No fever.  Has known COPD.  She has taken over-the-counter medication without relief.  No reported sick contacts.  No other associated symptoms.  No other complaints.  Past Medical History:  Diagnosis Date   Basal cell carcinoma 04/25/2018   R mid infraclavicular   Cancer (HCC)    skin ca   Fibromyalgia    GERD (gastroesophageal reflux disease)    Hypertension    Interstitial cystitis     Past Surgical History:  Procedure Laterality Date   ABDOMINAL HYSTERECTOMY     CARPAL TUNNEL RELEASE Bilateral    CERVICAL FUSION     CHOLECYSTECTOMY     OOPHORECTOMY      OB History   No obstetric history on file.      Home Medications    Prior to Admission medications   Medication Sig Start Date End Date Taking? Authorizing Provider  albuterol (VENTOLIN HFA) 108 (90 Base) MCG/ACT inhaler Inhale 2 puffs into the lungs every 4 (four) hours as needed for wheezing or shortness of breath. 06/25/20  Yes Domenick Gong, MD  amitriptyline (ELAVIL) 50 MG tablet Take by mouth. 05/09/19 11/04/20 Yes [provider]  azithromycin (ZITHROMAX) 250 MG tablet 2 tablets on day 1, then 1 tablet daily on days 2-5. 11/04/20  Yes Dyshaun Bonzo G, DO  budesonide-formoterol (SYMBICORT) 80-4.5 MCG/ACT inhaler Inhale into the lungs. 07/22/20 07/22/21 Yes [provider]  cetirizine (ZYRTEC) 10 MG tablet  02/20/20  Yes [provider]  chlorpheniramine-HYDROcodone (TUSSIONEX PENNKINETIC ER) 10-8 MG/5ML SUER Take 5 mLs by mouth at bedtime as needed. 11/04/20  Yes Laiklynn Raczynski G, DO  Cholecalciferol (VITAMIN D3)  10 MCG (400 UNIT) CAPS  02/20/20  Yes [provider]  Clobetasol Propionate (CLOBEX) 0.05 % shampoo Shampoo into scalp let sit 10 minutes then wash out. Use 2-3 days per week. Alternate with Ketoconazole shampoo. 05/01/20  Yes Deirdre Evener, MD  Coenzyme Q10 (COQ10) 200 MG CAPS  02/20/20  Yes [provider]  Aleda Grana ALLERGY RELIEF 50 MCG/ACT nasal spray  02/20/20  Yes [provider]  fluocinonide (LIDEX) 0.05 % external solution Apply 1 application topically as directed. Apply at bedtime to scalp, leave on overnight, wash out in AM. Use daily for 2 weeks then decrease to 3-5 days per week PRN. 05/20/20  Yes Deirdre Evener, MD  gabapentin (NEURONTIN) 600 MG tablet Take by mouth. 05/09/19 11/04/20 Yes [provider]  ibuprofen (ADVIL) 600 MG tablet Take 1 tablet (600 mg total) by mouth every 6 (six) hours as needed. 06/25/20  Yes Domenick Gong, MD  ketoconazole (NIZORAL) 2 % shampoo Apply 1 application topically 2 (two) times a week. 07/25/20  Yes [provider]  Boris Lown Oil 1000 MG CAPS Take 1 tablet by mouth daily.   Yes [provider]  Lactobacillus-Inulin (CULTURELLE DIGESTIVE HEALTH) CAPS Take by mouth.   Yes [provider]  lisinopril (ZESTRIL) 20 MG tablet Take 1 tablet by mouth daily. 07/12/19  Yes [provider]  Meth-Hyo-M Bl-Na Phos-Ph Sal (URIBEL) 118 MG CAPS Take by mouth.   Yes [provider]  Morphine-Naltrexone  100-4 MG CPCR TAKE 1 CAPSULE BY MOUTH ONCE daily 03/28/20  Yes [provider]  nystatin (MYCOSTATIN) 100000 UNIT/ML suspension Take 5 mLs by mouth 4 (four) times daily. 07/11/20  Yes [provider]  phentermine (ADIPEX-P) 37.5 MG tablet Take by mouth. 04/26/20 11/04/20 Yes [provider]  predniSONE (DELTASONE) 50 MG tablet 1 tablet daily x 5 days 11/04/20  Yes Kitrina Maurin G, DO  RABEprazole (ACIPHEX) 20 MG tablet Take 1 tablet by mouth daily. 01/09/20  Yes [provider]  Spacer/Aero-Holding Chambers (AEROCHAMBER PLUS) inhaler Use as instructed 06/25/20  Yes Melynda Ripple, MD  tiZANidine (ZANAFLEX) 4 MG tablet Take by mouth. 05/09/19  Yes [provider]    Family History Family History  Problem Relation Age of Onset   Breast cancer Mother 37   Bladder Cancer Mother    Hypertension Mother    Thyroid disease Mother    Lung cancer Father    Heart disease Father    COPD Father     Social History Social History   Tobacco Use   Smoking status: Former Smoker    Packs/day: 0.75    Years: 40.00    Pack years: 30.00    Types: Cigarettes    Quit date: 06/10/2020    Years since quitting: 0.4   Smokeless tobacco: Never Used  Vaping Use   Vaping Use: Never used  Substance Use Topics   Alcohol use: Yes    Comment: social   Drug use: Never     Allergies   Patient has no known allergies.   Review of Systems Review of Systems Per HPI  Physical Exam Triage Vital Signs ED Triage Vitals  Enc Vitals Group     BP 11/04/20 1029 (!) 161/87     Pulse Rate 11/04/20 1029 64     Resp 11/04/20 1029 18     Temp 11/04/20 1029 98.5 F (36.9 C)     Temp Source 11/04/20 1029 Oral     SpO2 11/04/20 1029 99 %     Weight 11/04/20 1025 155 lb (70.3 kg)     Height 11/04/20 1025 5\' 1"  (1.549 m)     Head Circumference --      Peak Flow --      Pain Score 11/04/20 1024 2     Pain Loc --      Pain Edu? --      Excl. in [place]? --    Updated Vital Signs BP (!) 161/87 (BP Location: Left Arm)    Pulse 64    Temp 98.5 F (36.9 C) (Oral)    Resp 18    Ht 5\' 1"  (1.549 m)    Wt 70.3 kg    SpO2 99%    BMI 29.29 kg/m   Visual Acuity Right Eye Distance:   Left Eye Distance:   Bilateral Distance:    Right Eye Near:   Left Eye Near:    Bilateral Near:     Physical Exam Constitutional:      General: She is not in acute distress.    Appearance: Normal appearance. She is not ill-appearing.  HENT:     Head: Normocephalic and  atraumatic.  Eyes:     General:        Right eye: No discharge.        Left eye: No discharge.     Conjunctiva/sclera: Conjunctivae normal.  Cardiovascular:     Rate and Rhythm: Normal rate and regular rhythm.  Pulmonary:  Effort: Pulmonary effort is normal.     Breath sounds: Wheezing present.  Neurological:     Mental Status: She is alert.  Psychiatric:        Mood and Affect: Mood normal.        Behavior: Behavior normal.    UC Treatments / Results  Labs (all labs ordered are listed, but only abnormal results are displayed) Labs Reviewed  RESP PANEL BY RT-PCR (FLU A&B, COVID) [id number]    EKG   Radiology No results found.  Procedures Procedures (including critical care time)  Medications Ordered in UC Medications - No data to display  Initial Impression / Assessment and Plan / UC Course  I have reviewed the triage vital signs and the nursing notes.  Pertinent labs & imaging results that were available during my care of the patient were reviewed by me and considered in my medical decision making (see chart for details).    59 year old female presents with COPD exacerbation.  Covid testing negative today.  Treating with azithromycin, Tussionex, prednisone.  Final Clinical Impressions(s) / UC Diagnoses   Final diagnoses:  COPD exacerbation [facility])     Discharge Instructions     Medications as prescribed.  Take care  Dr. Lacinda Axon    ED Prescriptions    Medication Sig Dispense Auth. Provider   azithromycin (ZITHROMAX) 250 MG tablet 2 tablets on day 1, then 1 tablet daily on days 2-5. 6 tablet [place], Jhonatan Lomeli G, DO   chlorpheniramine-HYDROcodone (TUSSIONEX PENNKINETIC ER) 10-8 MG/5ML SUER Take 5 mLs by mouth at bedtime as needed. 60 mL Arles Rumbold G, DO   predniSONE (DELTASONE) 50 MG tablet 1 tablet daily x 5 days 5 tablet Thersa Salt G, DO     PDMP not reviewed this encounter.   Coral Spikes, DO 11/04/20 1249

## 2020-11-04 NOTE — Discharge Instructions (Signed)
Medications as prescribed. ° °Take care ° °Dr. Lacora Folmer  °

## 2020-11-04 NOTE — ED Triage Notes (Signed)
Patient states that she has been having nasal congestion and cough since Thursday. Patient states that she did do an at home test for covid on Saturday and was negative. Patient states that she would like to rule out pneumonia. Reports that she does have a history of this.

## 2020-11-18 ENCOUNTER — Other Ambulatory Visit: Payer: Self-pay | Admitting: Family Medicine

## 2020-11-18 DIAGNOSIS — Z1231 Encounter for screening mammogram for malignant neoplasm of breast: Secondary | ICD-10-CM

## 2020-11-25 ENCOUNTER — Ambulatory Visit: Payer: Medicare Other | Admitting: Dermatology

## 2020-12-09 ENCOUNTER — Ambulatory Visit
Admission: RE | Admit: 2020-12-09 | Discharge: 2020-12-09 | Disposition: A | Discharge status: Home / Self Care | Payer: Medicare Other | Source: Ambulatory Visit | Attending: Family Medicine | Admitting: Family Medicine

## 2020-12-09 ENCOUNTER — Other Ambulatory Visit: Payer: Self-pay

## 2020-12-09 DIAGNOSIS — Z1231 Encounter for screening mammogram for malignant neoplasm of breast: Secondary | ICD-10-CM | POA: Insufficient documentation

## 2020-12-23 ENCOUNTER — Other Ambulatory Visit: Payer: Self-pay | Admitting: Orthopedic Surgery

## 2020-12-23 DIAGNOSIS — M5416 Radiculopathy, lumbar region: Secondary | ICD-10-CM

## 2020-12-25 ENCOUNTER — Ambulatory Visit: Payer: Medicare Other | Admitting: Dermatology

## 2020-12-25 ENCOUNTER — Encounter: Payer: Self-pay | Admitting: Dermatology

## 2020-12-25 ENCOUNTER — Other Ambulatory Visit: Payer: Self-pay

## 2020-12-25 DIAGNOSIS — I781 Nevus, non-neoplastic: Secondary | ICD-10-CM | POA: Diagnosis not present

## 2020-12-25 DIAGNOSIS — L821 Other seborrheic keratosis: Secondary | ICD-10-CM | POA: Diagnosis not present

## 2020-12-25 DIAGNOSIS — L820 Inflamed seborrheic keratosis: Secondary | ICD-10-CM | POA: Diagnosis not present

## 2020-12-25 DIAGNOSIS — L578 Other skin changes due to chronic exposure to nonionizing radiation: Secondary | ICD-10-CM | POA: Diagnosis not present

## 2020-12-25 DIAGNOSIS — L819 Disorder of pigmentation, unspecified: Secondary | ICD-10-CM

## 2020-12-25 NOTE — Progress Notes (Signed)
   Follow-Up Visit   Subjective  Alice Le is a 60 y.o. female who presents for the following: growths (R eyelid >43yr, irritating/Back, scaly  ~30m/) and Skin Tag (L neck, necklace irritates).  The following portions of the chart were reviewed this encounter and updated as appropriate:   Tobacco  Allergies  Meds  Problems  Med Hx  Surg Hx  Fam Hx      Review of Systems:  No other skin or systemic complaints except as noted in HPI or Assessment and Plan.  Objective  Well appearing patient in no apparent distress; mood and affect are within normal limits.  A focused examination was performed including face, back, neck. Relevant physical exam findings are noted in the Assessment and Plan.  Objective  Left Lower Back x 2, neck x 2 (4), R mid upper eyelid margin x 1, R upper eyelid x 2 (3): Erythematous keratotic or waxy stuck-on papule or plaque.   Objective  R leg: Telangiectasias R leg, dyschromia R leg   Assessment & Plan  Inflamed seborrheic keratosis (7) Left Lower Back x 2, neck x 2 (4); R mid upper eyelid margin x 1, R upper eyelid x 2 (3)  Destruction of lesion - Left Lower Back x 2, neck x 2, R mid upper eyelid margin x 1, R upper eyelid x 2 Complexity: simple   Destruction method: cryotherapy   Informed consent: discussed and consent obtained   Timeout:  patient name, date of birth, surgical site, and procedure verified Lesion destroyed using liquid nitrogen: Yes   Region frozen until ice ball extended beyond lesion: Yes   Outcome: patient tolerated procedure well with no complications   Post-procedure details: wound care instructions given    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  - Call for new or changing lesions.  Seborrheic Keratoses - Stuck-on, waxy, tan-brown papules and plaques  - Discussed benign etiology and prognosis. - Observe - Call for any changes  Return for as scheduled for [facility].  I, Othelia Pulling, RMA, am acting as scribe for Sarina Ser, MD .  Documentation: I have reviewed the above documentation for accuracy and completeness, and I agree with the above.  Sarina Ser, MD

## 2021-01-03 ENCOUNTER — Ambulatory Visit
Admission: RE | Admit: 2021-01-03 | Discharge: 2021-01-03 | Disposition: A | Discharge status: Home / Self Care | Payer: Medicare Other | Source: Ambulatory Visit | Attending: Orthopedic Surgery | Admitting: Orthopedic Surgery

## 2021-01-03 ENCOUNTER — Other Ambulatory Visit: Payer: Self-pay

## 2021-01-03 DIAGNOSIS — M5416 Radiculopathy, lumbar region: Secondary | ICD-10-CM | POA: Insufficient documentation

## 2021-02-24 ENCOUNTER — Telehealth: Payer: Self-pay | Admitting: *Deleted

## 2021-02-24 DIAGNOSIS — Z122 Encounter for screening for malignant neoplasm of respiratory organs: Secondary | ICD-10-CM

## 2021-02-24 DIAGNOSIS — Z87891 Personal history of nicotine dependence: Secondary | ICD-10-CM

## 2021-02-24 NOTE — Telephone Encounter (Signed)
Spoke to patient via telephone. Updated smoking history, former smoker, quit 06/2020; 0.75 ppd x 40 yrs. Annual lung screening scan scheduled for 03/12/21 @ 10:00 am.

## 2021-03-12 ENCOUNTER — Ambulatory Visit
Admission: RE | Admit: 2021-03-12 | Discharge: 2021-03-12 | Disposition: A | Discharge status: Home / Self Care | Payer: Medicare Other | Source: Ambulatory Visit | Attending: Oncology | Admitting: Oncology

## 2021-03-12 ENCOUNTER — Other Ambulatory Visit: Payer: Self-pay

## 2021-03-12 DIAGNOSIS — Z122 Encounter for screening for malignant neoplasm of respiratory organs: Secondary | ICD-10-CM | POA: Diagnosis present

## 2021-03-12 DIAGNOSIS — Z87891 Personal history of nicotine dependence: Secondary | ICD-10-CM | POA: Diagnosis present

## 2021-03-18 ENCOUNTER — Encounter: Payer: Self-pay | Admitting: *Deleted

## 2021-04-08 IMAGING — MG MM DIGITAL SCREENING BILAT W/ TOMO AND CAD
8 series · 8 of 24 positions shown · non-contrast
Comparison: Previous exam(s).

CLINICAL DATA: Screening.

EXAM:
DIGITAL SCREENING BILATERAL MAMMOGRAM WITH TOMO AND CAD

[R CC synth-2D]
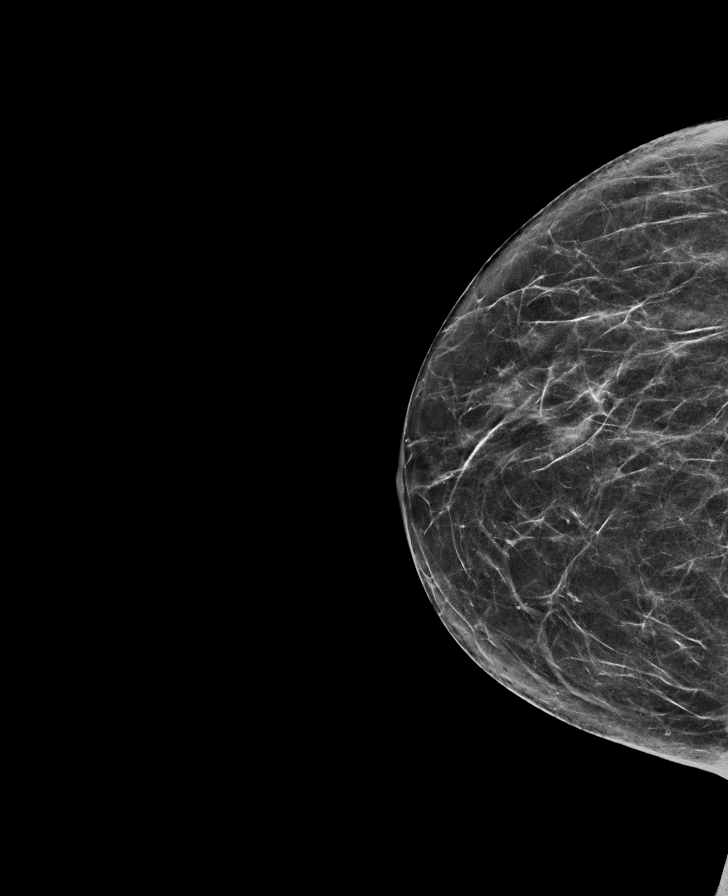

[L CC synth-2D]
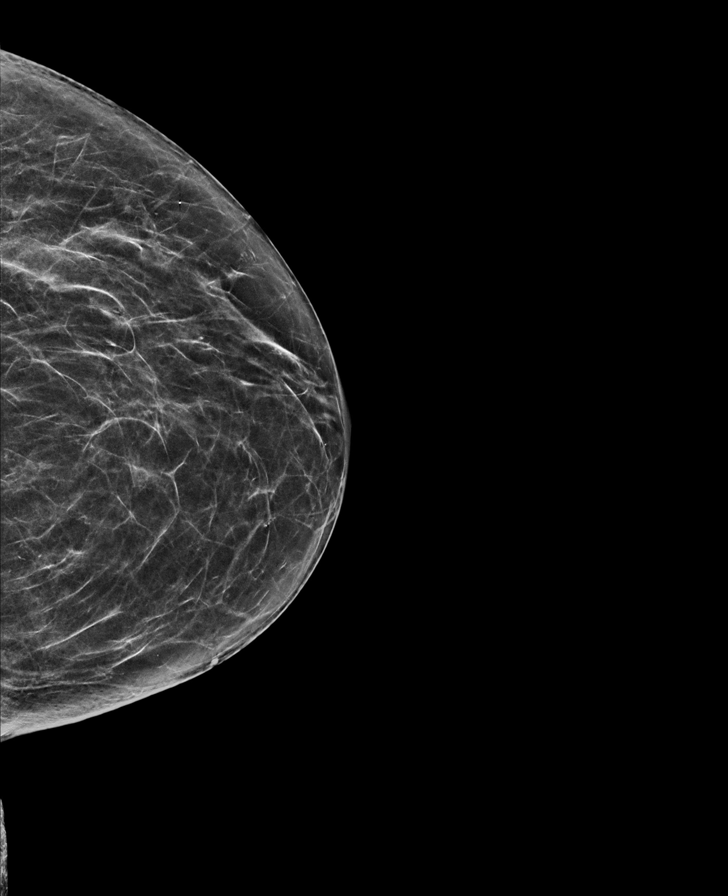

[R MLO synth-2D]
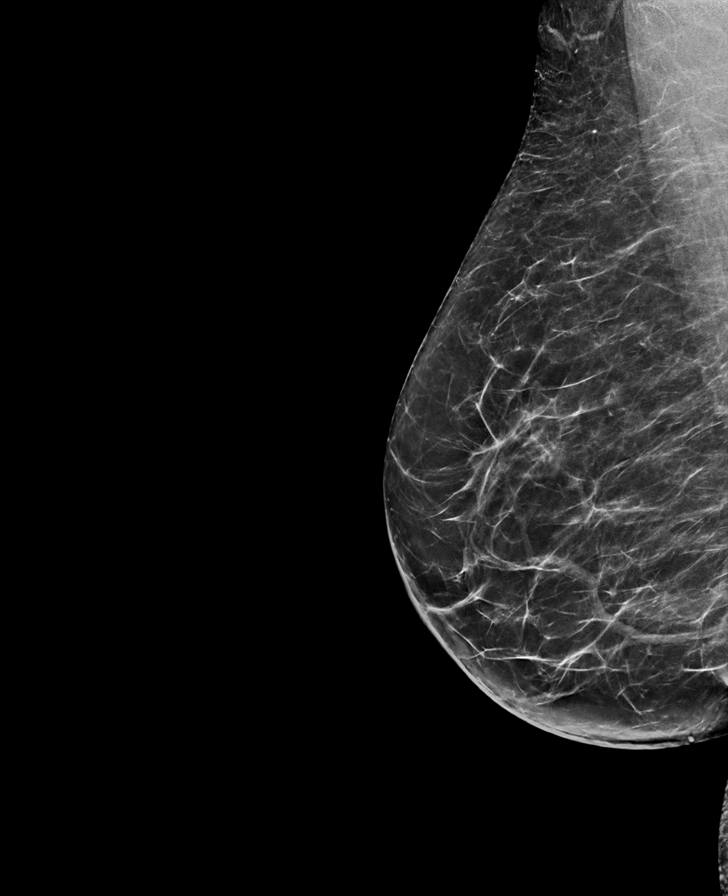

[L MLO synth-2D]
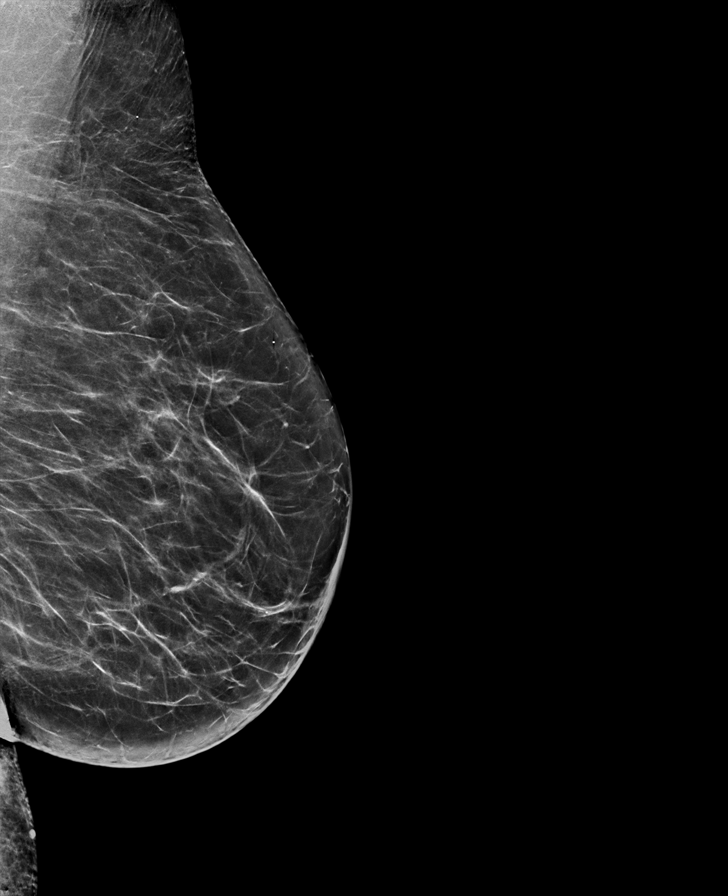

[L MLO tomo · tomo slice 35/69.0]
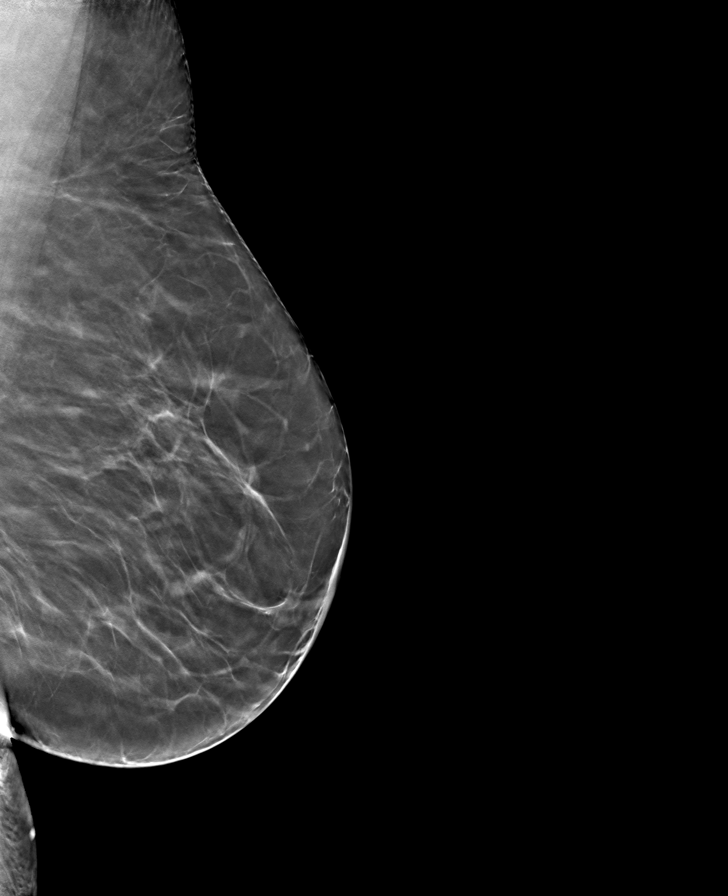

[L CC tomo · tomo slice 31/60.0]
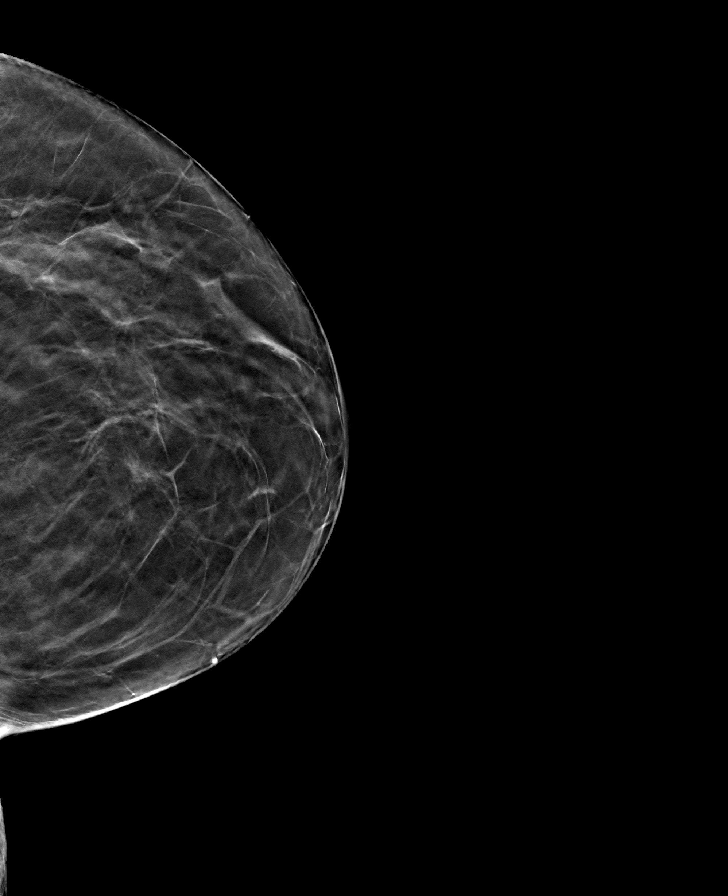

[R MLO tomo · tomo slice 35/69.0]
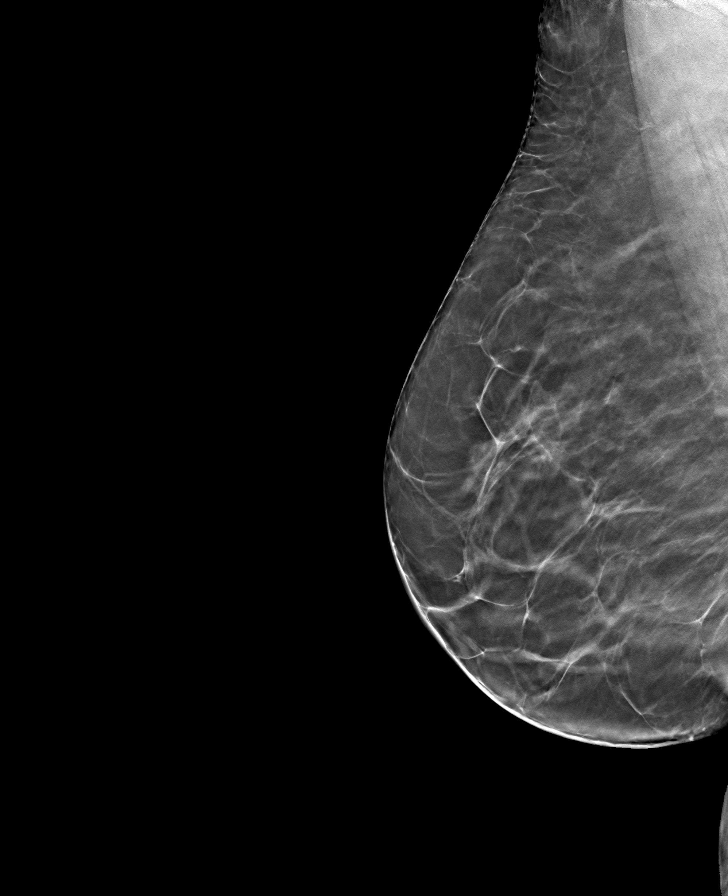

[R CC tomo · tomo slice 31/62.0]
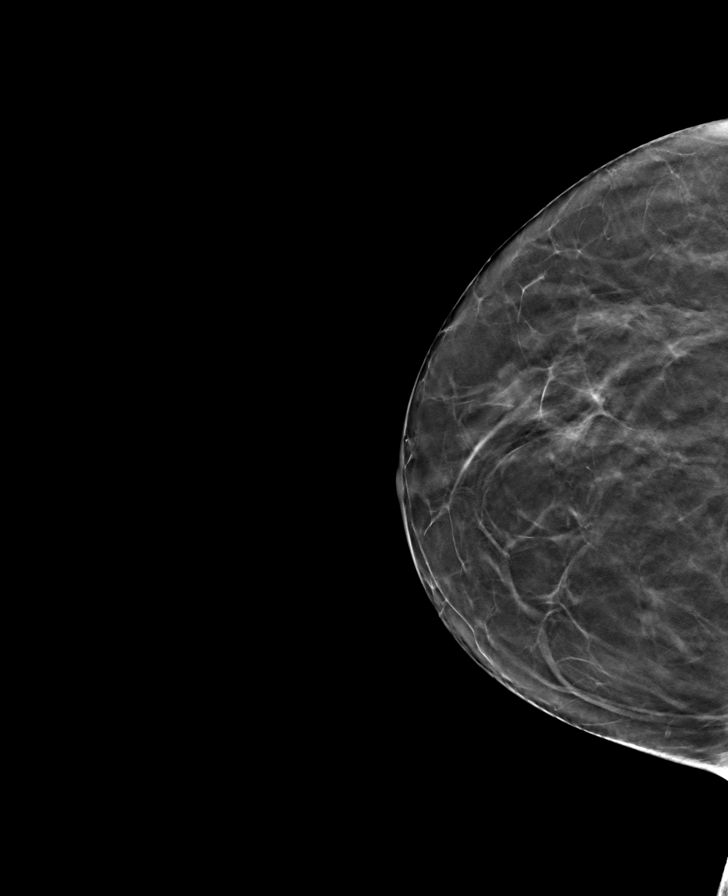

[8 of 24 positions shown; findings below may reference images not displayed]

ACR Breast Density Category b: There are scattered areas of
fibroglandular density.
FINDINGS: There are no findings suspicious for malignancy. The images were
evaluated with computer-aided detection.
IMPRESSION: No mammographic evidence of malignancy. A result letter of this
screening mammogram will be mailed directly to the patient.

RECOMMENDATION:
Screening mammogram in one year. (Code:ZP-7-VX7)

BI-RADS CATEGORY  1: Negative.

## 2021-04-28 ENCOUNTER — Other Ambulatory Visit: Payer: Self-pay

## 2021-04-28 ENCOUNTER — Emergency Department
Admission: EM | Admit: 2021-04-28 | Discharge: 2021-04-28 | Disposition: A | Discharge status: Home / Self Care | Payer: Medicare Other | Attending: Emergency Medicine | Admitting: Emergency Medicine

## 2021-04-28 ENCOUNTER — Emergency Department: Payer: Medicare Other

## 2021-04-28 DIAGNOSIS — I10 Essential (primary) hypertension: Secondary | ICD-10-CM | POA: Diagnosis not present

## 2021-04-28 DIAGNOSIS — Z85828 Personal history of other malignant neoplasm of skin: Secondary | ICD-10-CM | POA: Diagnosis not present

## 2021-04-28 DIAGNOSIS — R519 Headache, unspecified: Secondary | ICD-10-CM | POA: Diagnosis not present

## 2021-04-28 DIAGNOSIS — Z87891 Personal history of nicotine dependence: Secondary | ICD-10-CM | POA: Insufficient documentation

## 2021-04-28 DIAGNOSIS — R030 Elevated blood-pressure reading, without diagnosis of hypertension: Secondary | ICD-10-CM | POA: Diagnosis present

## 2021-04-28 DIAGNOSIS — R0789 Other chest pain: Secondary | ICD-10-CM

## 2021-04-28 LAB — BASIC METABOLIC PANEL
Anion gap: 10 (ref 5–15)
BUN: 13 mg/dL (ref 8–23)
CO2: 26 mmol/L (ref 22–32)
Calcium: 9 mg/dL (ref 8.9–10.3)
Chloride: 101 mmol/L (ref 98–111)
Creatinine, Ser: 0.77 mg/dL (ref 0.44–1.00)
GFR, Estimated: >60 mL/min (ref >60)
Glucose, Bld: 105 mg/dL — ABNORMAL HIGH (ref 70–99)
Potassium: 4.4 mmol/L (ref 3.5–5.1)
Sodium: 137 mmol/L (ref 135–145)

## 2021-04-28 LAB — CBC
HCT: 38.1 % (ref 36.0–46.0)
Hemoglobin: 13.3 g/dL (ref 12.0–15.0)
MCH: 34.3 pg — ABNORMAL HIGH (ref 26.0–34.0)
MCHC: 34.9 g/dL (ref 30.0–36.0)
MCV: 98.2 fL (ref 80.0–100.0)
Platelets: 239 10*3/uL (ref 150–400)
RBC: 3.88 MIL/uL (ref 3.87–5.11)
RDW: 13.6 % (ref 11.5–15.5)
WBC: 6.9 10*3/uL (ref 4.0–10.5)
nRBC: 0 % (ref 0.0–0.2)

## 2021-04-28 LAB — TROPONIN I (HIGH SENSITIVITY): Troponin I (High Sensitivity): 3 ng/L (ref <18)

## 2021-04-28 MED ORDER — LISINOPRIL 20 MG PO TABS: 40.0000 mg | ORAL_TABLET | Freq: Every day | ORAL | 1 refills | Status: DC

## 2021-04-28 NOTE — ED Triage Notes (Signed)
Pt via EMS from home. Pt c/o HTN, dizziness, headache, and generalized non-radiating CP. Pt states that the headache has been intermittent but the CP and dizziness started today. Denies any cardiac hx. Pt is A&Ox4 and NAD.

## 2021-04-28 NOTE — ED Provider Notes (Signed)
[facility] Emergency Department Provider Note   ____________________________________________    I have reviewed the triage vital signs and the nursing notes.   HISTORY  Chief Complaint Hypertension, Headache, and Chest Pain     HPI Alice Le is a 60 y.o. female who reports that she has been having elevated blood pressures for some time now.  Frequently with elevated blood pressure she has headaches and occasional chest tightness.  Today she also felt somewhat dizzy and hence called EMS.  Currently she is feeling well, chest is much improved.  No shortness of breath.  No leg swelling.  She does take lisinopril, PCP tried to add hydrochlorothiazide however patient did not tolerate.  Past Medical History:  Diagnosis Date   Basal cell carcinoma 04/25/2018   R mid infraclavicular   Cancer (HCC)    skin ca   Fibromyalgia    GERD (gastroesophageal reflux disease)    Hypertension    Interstitial cystitis     There are no problems to display for this patient.   Past Surgical History:  Procedure Laterality Date   ABDOMINAL HYSTERECTOMY     CARPAL TUNNEL RELEASE Bilateral    CERVICAL FUSION     CHOLECYSTECTOMY     OOPHORECTOMY      Prior to Admission medications   Medication Sig Start Date End Date Taking? Authorizing Provider  albuterol (VENTOLIN HFA) 108 (90 Base) MCG/ACT inhaler Inhale 2 puffs into the lungs every 4 (four) hours as needed for wheezing or shortness of breath. 06/25/20   Melynda Ripple, MD  amitriptyline (ELAVIL) 50 MG tablet Take by mouth. 05/09/19 11/04/20  [provider]  azithromycin (ZITHROMAX) 250 MG tablet 2 tablets on day 1, then 1 tablet daily on days 2-5. 11/04/20   Coral Spikes, DO  budesonide-formoterol (SYMBICORT) 80-4.5 MCG/ACT inhaler Inhale into the lungs. 07/22/20 07/22/21  [provider]  cetirizine (ZYRTEC) 10 MG tablet  02/20/20   [provider]  chlorpheniramine-HYDROcodone  (TUSSIONEX PENNKINETIC ER) 10-8 MG/5ML SUER Take 5 mLs by mouth at bedtime as needed. 11/04/20   Coral Spikes, DO  Cholecalciferol (VITAMIN D3) 10 MCG (400 UNIT) CAPS  02/20/20   [provider]  Clobetasol Propionate (CLOBEX) 0.05 % shampoo Shampoo into scalp let sit 10 minutes then wash out. Use 2-3 days per week. Alternate with Ketoconazole shampoo. 05/01/20   Ralene Bathe, MD  Coenzyme Q10 (COQ10) 200 MG CAPS  02/20/20   [provider]  Asencion Islam ALLERGY RELIEF 50 MCG/ACT nasal spray  02/20/20   [provider]  fluocinonide (LIDEX) 0.05 % external solution Apply 1 application topically as directed. Apply at bedtime to scalp, leave on overnight, wash out in AM. Use daily for 2 weeks then decrease to 3-5 days per week PRN. 05/20/20   Ralene Bathe, MD  gabapentin (NEURONTIN) 600 MG tablet Take by mouth. 05/09/19 11/04/20  [provider]  gabapentin (NEURONTIN) 600 MG tablet Take by mouth. 12/20/20 12/15/21  [provider]  ibuprofen (ADVIL) 600 MG tablet Take 1 tablet (600 mg total) by mouth every 6 (six) hours as needed. 06/25/20   Melynda Ripple, MD  ketoconazole (NIZORAL) 2 % shampoo Apply 1 application topically 2 (two) times a week. 07/25/20   [provider]  Javier Docker Oil 1000 MG CAPS Take 1 tablet by mouth daily.    [provider]  Lactobacillus-Inulin ([place]) CAPS Take by mouth.    [provider]  lisinopril (  ZESTRIL) 20 MG tablet Take 2 tablets (40 mg total) by mouth daily. 04/28/21 05/28/21  Lavonia Drafts, MD  Meth-Hyo-M Bl-Na Phos-Ph Sal (URIBEL) 118 MG CAPS Take by mouth.    [provider]  Morphine-Naltrexone 100-4 MG CPCR TAKE 1 CAPSULE BY MOUTH ONCE daily 03/28/20   [provider]  nystatin (MYCOSTATIN) 100000 UNIT/ML suspension Take 5 mLs by mouth 4 (four) times daily. 07/11/20   [provider]  phentermine (ADIPEX-P) 37.5 MG tablet Take by mouth. 04/26/20  11/04/20  [provider]  predniSONE (DELTASONE) 50 MG tablet 1 tablet daily x 5 days 11/04/20   Coral Spikes, DO  RABEprazole (ACIPHEX) 20 MG tablet Take 1 tablet by mouth daily. 01/09/20   [provider]  Spacer/Aero-Holding Chambers (AEROCHAMBER PLUS) inhaler Use as instructed 06/25/20   Melynda Ripple, MD  tiZANidine (ZANAFLEX) 4 MG tablet Take by mouth. 05/09/19   [provider]     Allergies Patient has no known allergies.  Family History  Problem Relation Age of Onset   Breast cancer Mother 63   Bladder Cancer Mother    Hypertension Mother    Thyroid disease Mother    Lung cancer Father    Heart disease Father    COPD Father     Social History Social History   Tobacco Use   Smoking status: Former    Packs/day: 0.75    Years: 40.00    Pack years: 30.00    Types: Cigarettes    Quit date: 06/10/2020    Years since quitting: 0.8   Smokeless tobacco: Never  Vaping Use   Vaping Use: Never used  Substance Use Topics   Alcohol use: Yes    Comment: social   Drug use: Never    Review of Systems  Constitutional: No fever/chills Eyes: No visual changes.  ENT: No sore throat. Cardiovascular: As above Respiratory: Denies shortness of breath. Gastrointestinal: No abdominal pain.  No nausea, no vomiting.   Genitourinary: Negative for dysuria. Musculoskeletal: Negative for back pain. Skin: Negative for rash. Neurological: Negative for headaches or weakness   ____________________________________________   PHYSICAL EXAM:  VITAL SIGNS: ED Triage Vitals  Enc Vitals Group     BP 04/28/21 1148 (!) 188/91     Pulse Rate 04/28/21 1148 (!) 59     Resp 04/28/21 1148 16     Temp 04/28/21 1148 98.4 F (36.9 C)     Temp Source 04/28/21 1148 Oral     SpO2 04/28/21 1148 100 %     Weight 04/28/21 1150 70.3 kg (155 lb)     Height 04/28/21 1150 1.549 m (5\' 1" )     Head Circumference --      Peak Flow --      Pain Score 04/28/21 1154 4      Pain Loc --      Pain Edu? --      Excl. in [place]? --     Constitutional: Alert and oriented. No acute distress. Pleasant and interactive Eyes: Conjunctivae are normal.   Nose: No congestion/rhinnorhea. Mouth/Throat: Mucous membranes are moist.   Neck:  Painless ROM Cardiovascular: Normal rate, regular rhythm. Grossly normal heart sounds.  Good peripheral circulation. Respiratory: Normal respiratory effort.  No retractions. Lungs CTAB.  Genitourinary: deferred Musculoskeletal: No lower extremity tenderness nor edema.  Warm and well perfused Neurologic:  Normal speech and language. No gross focal neurologic deficits are appreciated.  Skin:  Skin is warm, dry and intact. No rash noted. Psychiatric:  Mood and affect are normal. Speech and behavior are normal.  ____________________________________________   LABS (all labs ordered are listed, but only abnormal results are displayed)  Labs Reviewed  BASIC METABOLIC PANEL - Abnormal; Notable for the following components:      Result Value   Glucose, Bld 105 (*)    All other components within normal limits  CBC - Abnormal; Notable for the following components:   MCH 34.3 (*)    All other components within normal limits  TROPONIN I (HIGH SENSITIVITY)   ____________________________________________  EKG  ED ECG REPORT I, Lavonia Drafts, the attending physician, personally viewed and interpreted this ECG.  Date: 04/28/2021  Rhythm: normal sinus rhythm QRS Axis: normal Intervals: normal ST/T Wave abnormalities: normal Narrative Interpretation: no evidence of acute ischemia  ____________________________________________  RADIOLOGY  Chest x-ray viewed by me, no acute abnormality ____________________________________________   PROCEDURES  Procedure(s) performed: No  Procedures   Critical Care performed: No ____________________________________________   INITIAL IMPRESSION / ASSESSMENT AND PLAN / ED COURSE  Pertinent  labs & imaging results that were available during my care of the patient were reviewed by me and considered in my medical decision making (see chart for details).   Patient well-appearing and feeling much better here in the emergency department.  She is not having any chest pain.  Blood pressure is elevated as it has been multiple times in the past when she has been in the emergency department  Lab work is quite reassuring, high sensitive troponin is normal.  EKG reassuring, chest x-ray CT head unremarkable  Will increase the patient's lisinopril dosing, she will follow-up with her PCP for reevaluation, strict return precautions discussed, patient agrees with this plan.    ____________________________________________   FINAL CLINICAL IMPRESSION(S) / ED DIAGNOSES  Final diagnoses:  Primary hypertension  Atypical chest pain        Note:  This document was prepared using Dragon voice recognition software and may include unintentional dictation errors.    Lavonia Drafts, MD 04/28/21 2029

## 2021-04-28 NOTE — ED Triage Notes (Signed)
Pt comes into the ED via [facility] from home c/o HTN at 180/107.  Pt is symptomatic with headache and dizziness.  Pt was recently put on HCTZ.  NSR with EMS.  denies any nausea or vomiting.  Pt took 324 ASA prior to EMS arrival. 20g L AC.

## 2021-08-13 ENCOUNTER — Encounter: Payer: Self-pay | Admitting: Dermatology

## 2021-08-13 ENCOUNTER — Ambulatory Visit: Payer: Medicare Other | Admitting: Dermatology

## 2021-08-13 ENCOUNTER — Other Ambulatory Visit: Payer: Self-pay

## 2021-08-13 DIAGNOSIS — Z1283 Encounter for screening for malignant neoplasm of skin: Secondary | ICD-10-CM | POA: Diagnosis not present

## 2021-08-13 DIAGNOSIS — L821 Other seborrheic keratosis: Secondary | ICD-10-CM

## 2021-08-13 DIAGNOSIS — L820 Inflamed seborrheic keratosis: Secondary | ICD-10-CM

## 2021-08-13 DIAGNOSIS — Z85828 Personal history of other malignant neoplasm of skin: Secondary | ICD-10-CM

## 2021-08-13 DIAGNOSIS — L570 Actinic keratosis: Secondary | ICD-10-CM | POA: Diagnosis not present

## 2021-08-13 DIAGNOSIS — L578 Other skin changes due to chronic exposure to nonionizing radiation: Secondary | ICD-10-CM

## 2021-08-13 DIAGNOSIS — L814 Other melanin hyperpigmentation: Secondary | ICD-10-CM

## 2021-08-13 DIAGNOSIS — L719 Rosacea, unspecified: Secondary | ICD-10-CM

## 2021-08-13 DIAGNOSIS — D229 Melanocytic nevi, unspecified: Secondary | ICD-10-CM

## 2021-08-13 DIAGNOSIS — D1800 Hemangioma unspecified site: Secondary | ICD-10-CM

## 2021-08-13 DIAGNOSIS — B079 Viral wart, unspecified: Secondary | ICD-10-CM | POA: Diagnosis not present

## 2021-08-13 NOTE — Progress Notes (Signed)
Follow-Up Visit   Subjective  Alice Le is a 60 y.o. female who presents for the following: TBSE (Total body exam today. Pt has hx of BCC in 2019. She has a few spots on her face that she would like looked at today. ). Patient here for full body skin exam and skin cancer screening.  The following portions of the chart were reviewed this encounter and updated as appropriate:  Tobacco  Allergies  Meds  Problems  Med Hx  Surg Hx  Fam Hx     Review of Systems: No other skin or systemic complaints except as noted in HPI or Assessment and Plan.  Objective  Well appearing patient in no apparent distress; mood and affect are within normal limits.  A full examination was performed including scalp, head, eyes, ears, nose, lips, neck, chest, axillae, abdomen, back, buttocks, bilateral upper extremities, bilateral lower extremities, hands, feet, fingers, toes, fingernails, and toenails. All findings within normal limits unless otherwise noted below.  right fifth digit Verrucous papules -- Discussed viral etiology and contagion.   left cheek x 2, left superior forehead x 2 (3), right upper eyelid margin x 1 Erythematous keratotic or waxy stuck-on papule or plaque.   Face Mid face erythema with telangiectasias +/- scattered inflammatory papules.   right upper lip vermillion border x 1 Erythematous thin papules/macules with gritty scale.   Assessment & Plan  Viral warts, right fifth digit Discussed viral etiology and risk of spread.  Discussed multiple treatments may be required to clear warts.  Discussed possible post-treatment dyspigmentation and risk of recurrence.  Prior to procedure, discussed risks of blister formation, small wound, skin dyspigmentation, or rare scar following cryotherapy. Recommend Vaseline ointment to treated areas while healing.  Destruction of lesion - right fifth digit Complexity: simple   Destruction method: cryotherapy   Informed consent:  discussed and consent obtained   Timeout:  patient name, date of birth, surgical site, and procedure verified Lesion destroyed using liquid nitrogen: Yes   Region frozen until ice ball extended beyond lesion: Yes   Outcome: patient tolerated procedure well with no complications   Post-procedure details: wound care instructions given    Inflamed seborrheic keratosis left cheek x 2, left superior forehead x 2 (3); right upper eyelid margin x 1 Prior to procedure, discussed risks of blister formation, small wound, skin dyspigmentation, or rare scar following cryotherapy. Recommend Vaseline ointment to treated areas while healing.  Recheck eyelid on f/u  Destruction of lesion - left cheek x 2, left superior forehead x 2 Complexity: simple   Destruction method: cryotherapy   Informed consent: discussed and consent obtained   Timeout:  patient name, date of birth, surgical site, and procedure verified Lesion destroyed using liquid nitrogen: Yes   Region frozen until ice ball extended beyond lesion: Yes   Outcome: patient tolerated procedure well with no complications   Post-procedure details: wound care instructions given    Rosacea Face Rosacea is a chronic progressive skin condition usually affecting the face of adults, causing redness and/or acne bumps. It is treatable but not curable. It sometimes affects the eyes (ocular rosacea) as well. It may respond to topical and/or systemic medication and can flare with stress, sun exposure, alcohol, exercise and some foods.  Daily application of broad spectrum spf 30+ sunscreen to face is recommended to reduce flares.  Discussed laser /BBL as the best option for treatment. Pt has had done in the past.   AK (actinic keratosis) right upper  lip vermillion border x 1 Actinic keratoses are precancerous spots that appear secondary to cumulative UV radiation exposure/sun exposure over time. They are chronic with expected duration over 1 year. A portion  of actinic keratoses will progress to squamous cell carcinoma of the skin. It is not possible to reliably predict which spots will progress to skin cancer and so treatment is recommended to prevent development of skin cancer.  Recommend daily broad spectrum sunscreen SPF 30+ to sun-exposed areas, reapply every 2 hours as needed.  Recommend staying in the shade or wearing long sleeves, sun glasses (UVA+UVB protection) and wide brim hats (4-inch brim around the entire circumference of the hat). Call for new or changing lesions.  Prior to procedure, discussed risks of blister formation, small wound, skin dyspigmentation, or rare scar following cryotherapy. Recommend Vaseline ointment to treated areas while healing.  Recheck on f/u  Destruction of lesion - right upper lip vermillion border x 1 Complexity: simple   Destruction method: cryotherapy   Informed consent: discussed and consent obtained   Timeout:  patient name, date of birth, surgical site, and procedure verified Lesion destroyed using liquid nitrogen: Yes   Region frozen until ice ball extended beyond lesion: Yes   Outcome: patient tolerated procedure well with no complications   Post-procedure details: wound care instructions given   Lentigines - Scattered tan macules - Due to sun exposure - Benign-appearing, observe - Recommend daily broad spectrum sunscreen SPF 30+ to sun-exposed areas, reapply every 2 hours as needed. - Call for any changes  Seborrheic Keratoses - Stuck-on, waxy, tan-brown papules and/or plaques  - Benign-appearing - Discussed benign etiology and prognosis. - Observe - Call for any changes  Melanocytic Nevi - Tan-brown and/or pink-flesh-colored symmetric macules and papules - Benign appearing on exam today - Observation - Call clinic for new or changing moles - Recommend daily use of broad spectrum spf 30+ sunscreen to sun-exposed areas.   Hemangiomas - Red papules - Discussed benign nature -  Observe - Call for any changes  Actinic Damage - Chronic condition, secondary to cumulative UV/sun exposure - diffuse scaly erythematous macules with underlying dyspigmentation - Recommend daily broad spectrum sunscreen SPF 30+ to sun-exposed areas, reapply every 2 hours as needed.  - Staying in the shade or wearing long sleeves, sun glasses (UVA+UVB protection) and wide brim hats (4-inch brim around the entire circumference of the hat) are also recommended for sun protection.  - Call for new or changing lesions.  Skin cancer screening performed today.  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  - Call if any new or changing lesions are noted between office visits  Return in about 4 months (around 12/14/2021) for 3-4 mo f/u recheck AK and ISK on eyelid.  IHarriett Sine, CMA, am acting as scribe for Sarina Ser, MD. Documentation: I have reviewed the above documentation for accuracy and completeness, and I agree with the above.  Sarina Ser, MD

## 2021-08-13 NOTE — Patient Instructions (Addendum)

## 2021-11-12 ENCOUNTER — Other Ambulatory Visit: Payer: Self-pay | Admitting: Family Medicine

## 2021-11-12 DIAGNOSIS — Z1231 Encounter for screening mammogram for malignant neoplasm of breast: Secondary | ICD-10-CM

## 2021-12-17 ENCOUNTER — Ambulatory Visit: Payer: Medicare Other | Admitting: Dermatology

## 2022-01-02 ENCOUNTER — Other Ambulatory Visit: Payer: Self-pay | Admitting: Physical Medicine & Rehabilitation

## 2022-01-02 ENCOUNTER — Other Ambulatory Visit (HOSPITAL_COMMUNITY): Payer: Self-pay | Admitting: Physical Medicine & Rehabilitation

## 2022-01-02 DIAGNOSIS — G8929 Other chronic pain: Secondary | ICD-10-CM

## 2022-01-02 DIAGNOSIS — M48062 Spinal stenosis, lumbar region with neurogenic claudication: Secondary | ICD-10-CM

## 2022-01-05 ENCOUNTER — Inpatient Hospital Stay: Admission: RE | Admit: 2022-01-05 | Payer: Medicare Other | Source: Ambulatory Visit

## 2022-01-11 ENCOUNTER — Ambulatory Visit
Admission: RE | Admit: 2022-01-11 | Discharge: 2022-01-11 | Disposition: A | Discharge status: Home / Self Care | Payer: Medicare Other | Source: Ambulatory Visit | Attending: Physical Medicine & Rehabilitation | Admitting: Physical Medicine & Rehabilitation

## 2022-01-11 ENCOUNTER — Other Ambulatory Visit: Payer: Self-pay

## 2022-01-11 DIAGNOSIS — M5441 Lumbago with sciatica, right side: Secondary | ICD-10-CM | POA: Insufficient documentation

## 2022-01-11 DIAGNOSIS — M48062 Spinal stenosis, lumbar region with neurogenic claudication: Secondary | ICD-10-CM | POA: Insufficient documentation

## 2022-01-11 DIAGNOSIS — G8929 Other chronic pain: Secondary | ICD-10-CM

## 2022-01-11 DIAGNOSIS — M5442 Lumbago with sciatica, left side: Secondary | ICD-10-CM | POA: Diagnosis present

## 2022-01-15 ENCOUNTER — Other Ambulatory Visit: Payer: Self-pay | Admitting: Family Medicine

## 2022-01-15 ENCOUNTER — Other Ambulatory Visit (HOSPITAL_COMMUNITY): Payer: Self-pay | Admitting: Family Medicine

## 2022-01-15 ENCOUNTER — Other Ambulatory Visit: Payer: Self-pay | Admitting: *Deleted

## 2022-01-15 DIAGNOSIS — G4452 New daily persistent headache (NDPH): Secondary | ICD-10-CM

## 2022-01-16 ENCOUNTER — Ambulatory Visit
Admission: RE | Admit: 2022-01-16 | Discharge: 2022-01-16 | Disposition: A | Discharge status: Home / Self Care | Payer: Medicare Other | Source: Ambulatory Visit | Attending: Family Medicine | Admitting: Family Medicine

## 2022-01-16 ENCOUNTER — Other Ambulatory Visit: Payer: Self-pay

## 2022-01-16 DIAGNOSIS — G4452 New daily persistent headache (NDPH): Secondary | ICD-10-CM | POA: Diagnosis present

## 2022-01-21 ENCOUNTER — Ambulatory Visit
Admission: RE | Admit: 2022-01-21 | Discharge: 2022-01-21 | Disposition: A | Discharge status: Home / Self Care | Payer: Medicare Other | Source: Ambulatory Visit | Attending: Family Medicine | Admitting: Family Medicine

## 2022-01-21 ENCOUNTER — Other Ambulatory Visit: Payer: Self-pay

## 2022-01-21 DIAGNOSIS — Z1231 Encounter for screening mammogram for malignant neoplasm of breast: Secondary | ICD-10-CM | POA: Insufficient documentation

## 2022-04-06 NOTE — Addendum Note (Signed)
Encounter addended by: Annie Paras on: 04/06/2022 12:20 PM  Actions taken: Letter saved

## 2022-04-09 ENCOUNTER — Other Ambulatory Visit: Payer: Self-pay | Admitting: *Deleted

## 2022-04-09 DIAGNOSIS — Z87891 Personal history of nicotine dependence: Secondary | ICD-10-CM

## 2022-04-09 DIAGNOSIS — Z122 Encounter for screening for malignant neoplasm of respiratory organs: Secondary | ICD-10-CM

## 2022-04-20 ENCOUNTER — Ambulatory Visit
Admission: RE | Admit: 2022-04-20 | Discharge: 2022-04-20 | Disposition: A | Discharge status: Home / Self Care | Payer: Medicare Other | Source: Ambulatory Visit | Attending: Family Medicine | Admitting: Family Medicine

## 2022-04-20 DIAGNOSIS — Z122 Encounter for screening for malignant neoplasm of respiratory organs: Secondary | ICD-10-CM | POA: Diagnosis present

## 2022-04-20 DIAGNOSIS — Z87891 Personal history of nicotine dependence: Secondary | ICD-10-CM | POA: Insufficient documentation

## 2022-04-22 ENCOUNTER — Other Ambulatory Visit: Payer: Self-pay | Admitting: Acute Care

## 2022-04-22 DIAGNOSIS — Z87891 Personal history of nicotine dependence: Secondary | ICD-10-CM

## 2022-04-22 DIAGNOSIS — Z122 Encounter for screening for malignant neoplasm of respiratory organs: Secondary | ICD-10-CM

## 2022-06-04 ENCOUNTER — Ambulatory Visit: Payer: Medicare Other | Admitting: Neurosurgery

## 2022-06-04 DIAGNOSIS — M5416 Radiculopathy, lumbar region: Secondary | ICD-10-CM

## 2022-06-04 NOTE — Progress Notes (Signed)
Referring Physician:  Meade Maw, [address] [place],  [place] [address]  Primary Physician:  Sofie Hartigan, MD  History of Present Illness: 06/04/2022 Ms. Loise Bevis is doing better since last time I saw her.  She is able to do everything she needs to do.  She is no longer doing physical therapy or exercises.   03/02/2022 Ms. Awa Elsbernd is here today with a chief complaint of low back pain that radiates into the bilateral legs with the left leg much worse than the right. She is also complaining of numbness, tingling and weakness in the left leg.   She has been having pain as bad as 10 out of 10 with walking and sitting. Her left leg is much worse than the rest. Standing as well as heat and ice make it better.  She is doing some physical therapy which is helped her, though she has moderated her activity.  Bowel/Bladder Dysfunction: none  Conservative measures:  Physical therapy: currently participating in at [facility] therapy including regular antiinflammatories: gabapentin, hydrocodone, tizanidine Injections: has had epidural steroid injections 12/19/21: Bilateral S1 ESI (no relief) 07/28/21: Bilateral S1 ESI (no relief) 07/07/21: Bilateral L5-S1 ESI( 4 months of relief)   Past Surgery:  Cervical Fusion in [place] has no symptoms of cervical myelopathy.  The symptoms are causing a significant impact on the patient's life.   Review of Systems:  A 10 point review of systems is negative, except for the pertinent positives and negatives detailed in the HPI.  Past Medical History: Past Medical History:  Diagnosis Date   Basal cell carcinoma 04/25/2018   R mid infraclavicular   Cancer (HCC)    skin ca   Fibromyalgia    GERD (gastroesophageal reflux disease)    Hypertension    Interstitial cystitis     Past Surgical History: Past Surgical History:  Procedure Laterality Date   ABDOMINAL  HYSTERECTOMY     CARPAL TUNNEL RELEASE Bilateral    CERVICAL FUSION     CHOLECYSTECTOMY     OOPHORECTOMY      Allergies: Allergies as of 06/04/2022   (No Known Allergies)    Medications: No outpatient medications have been marked as taking for the 06/04/22 encounter (Office Visit) with Meade Maw, MD.    Social History: Social History   Tobacco Use   Smoking status: Former    Packs/day: 0.75    Years: 40.00    Total pack years: 30.00    Types: Cigarettes    Quit date: 06/10/2020    Years since quitting: 1.9   Smokeless tobacco: Never  Vaping Use   Vaping Use: Never used  Substance Use Topics   Alcohol use: Yes    Comment: social   Drug use: Never    Family Medical History: Family History  Problem Relation Age of Onset   Breast cancer Mother 68   Bladder Cancer Mother    Hypertension Mother    Thyroid disease Mother    Lung cancer Father    Heart disease Father    COPD Father     Physical Examination: No exam today - telephone visit   Medical Decision Making  Imaging: MRI L spine 01/11/22 IMPRESSION:  1. L5-S1 moderate left and mild right neural foraminal narrowing,  unchanged from the prior exam. The left lateral aspect of the disc  contacts the exiting left L5 nerve.  2. L4-L5 mild bilateral neural foraminal narrowing, unchanged from  the prior exam.   Electronically Signed    By: Merilyn Baba M.D.    On: 01/12/2022 03:57  I have personally reviewed the images and agree with the above interpretation.  Assessment and Plan: Ms. Gural is a pleasant 61 y.o. female with left L5 radiculopathy that appears to improved over time.  She is currently not limited from an activity standpoint.  I have recommended holding off on any consideration of surgical intervention given her improvement in symptoms.  If she has worsening symptoms over time, I am happy to see her back in clinic and reevaluate.     Thank you for involving me in the care of this  patient.      Carolan Avedisian K. Izora Ribas MD, [facility]

## 2022-07-23 ENCOUNTER — Ambulatory Visit: Payer: Medicare Other | Admitting: Neurosurgery

## 2022-07-23 ENCOUNTER — Encounter: Payer: Self-pay | Admitting: Neurosurgery

## 2022-07-23 VITALS — BP 138/94 | HR 60 | Ht 62.0 in | Wt 162.0 lb

## 2022-07-23 DIAGNOSIS — M5416 Radiculopathy, lumbar region: Secondary | ICD-10-CM

## 2022-07-23 NOTE — Progress Notes (Signed)
Referring Physician:  Sofie Hartigan, MD Alice Le,  [place] [id number]  Primary Physician:  Sofie Hartigan, MD  History of Present Illness: 07/23/2022 Alice Le returns to see me with a return left leg pain similar to that noted in my prior notes.  06/04/2022 Alice Le is doing better since last time I saw her.  She is able to do everything she needs to do.  She is no longer doing physical therapy or exercises.   03/02/2022 Alice Le is here today with a chief complaint of low back pain that radiates into the bilateral legs with the left leg much worse than the right. She is also complaining of numbness, tingling and weakness in the left leg.   She has been having pain as bad as 10 out of 10 with walking and sitting. Her left leg is much worse than the rest. Standing as well as heat and ice make it better.  She is doing some physical therapy which is helped her, though she has moderated her activity.  Bowel/Bladder Dysfunction: none  Conservative measures:  Physical therapy: currently participating in at [facility] therapy including regular antiinflammatories: gabapentin, hydrocodone, tizanidine Injections: has had epidural steroid injections 12/19/21: Bilateral S1 ESI (no relief) 07/28/21: Bilateral S1 ESI (no relief) 07/07/21: Bilateral L5-S1 ESI( 4 months of relief)   Past Surgery:  Cervical Fusion in [place] has no symptoms of cervical myelopathy.  The symptoms are causing a significant impact on the patient's life.   Review of Systems:  A 10 point review of systems is negative, except for the pertinent positives and negatives detailed in the HPI.  Past Medical History: Past Medical History:  Diagnosis Date   Basal cell carcinoma 04/25/2018   R mid infraclavicular   Cancer (HCC)    skin ca   Fibromyalgia    GERD (gastroesophageal reflux disease)    Hypertension    Interstitial cystitis      Past Surgical History: Past Surgical History:  Procedure Laterality Date   ABDOMINAL HYSTERECTOMY     CARPAL TUNNEL RELEASE Bilateral    CERVICAL FUSION     CHOLECYSTECTOMY     OOPHORECTOMY      Allergies: Allergies as of 07/23/2022 - Review Complete 08/13/2021  Allergen Reaction Noted   Amlodipine Swelling 07/16/2021   Hydrochlorothiazide Photosensitivity 03/19/2021    Medications: No outpatient medications have been marked as taking for the 07/23/22 encounter (Office Visit) with Meade Maw, MD.    Social History: Social History   Tobacco Use   Smoking status: Former    Packs/day: 0.75    Years: 40.00    Total pack years: 30.00    Types: Cigarettes    Quit date: 06/10/2020    Years since quitting: 2.1   Smokeless tobacco: Never   Tobacco comments:    Uses vape w/ nicotine  Vaping Use   Vaping Use: Every day   Devices: w/ nicotine  Substance Use Topics   Alcohol use: Yes    Comment: social   Drug use: Never    Family Medical History: Family History  Problem Relation Age of Onset   Breast cancer Mother 50   Bladder Cancer Mother    Hypertension Mother    Thyroid disease Mother    Lung cancer Father    Heart disease Father    COPD Father     Physical Examination: No exam today - telephone visit   Medical  Decision Making  Imaging: MRI L spine 01/11/22 IMPRESSION:  1. L5-S1 moderate left and mild right neural foraminal narrowing,  unchanged from the prior exam. The left lateral aspect of the disc  contacts the exiting left L5 nerve.  2. L4-L5 mild bilateral neural foraminal narrowing, unchanged from  the prior exam.   Electronically Signed    By: Merilyn Baba M.D.    On: 01/12/2022 03:57  I have personally reviewed the images and agree with the above interpretation.  Assessment and Plan: Alice Le is a pleasant 61 y.o. female with left L5 radiculopathy due to foraminal compression in the L5-S1 foramen on the left.  This is due to  combination of foraminal narrowing and disc herniation.  She has tried and failed conservative management including physical therapy and injections.  At this point, I think that left-sided L5-S1 far lateral discectomy is indicated.    I discussed the planned procedure at length with the patient, including the risks, benefits, alternatives, and indications. The risks discussed include but are not limited to bleeding, infection, need for reoperation, spinal fluid leak, stroke, vision loss, anesthetic complication, coma, paralysis, and even death. I also described in detail that improvement was not guaranteed.  The patient expressed understanding of these risks, and asked that we proceed with surgery. I described the surgery in layman's terms, and gave ample opportunity for questions, which were answered to the best of my ability.  Thank you for involving me in the care of this patient.      [name] K. Izora Ribas MD, [facility] Neurosurgery

## 2022-07-23 NOTE — Patient Instructions (Signed)
Please see below for information in regards to your upcoming surgery:  Planned surgery: Left L5-S1 far lateral discectomy   Surgery date: 08/26/22 - you will find out your arrival time the business day before your surgery.   Blood thinners:  stop aspirin 7 days prior, resume aspirin 14 days after       Pre-op appointment at [facility]: we will call you with a date/time for this. Pre-admit testing is located on the first floor of the [facility], [place], [address].   Pre-op labs may be done at your pre-op appointment. You are not required to fast for these labs.    Should you need to change your pre-op appointment, please call Pre-admit testing at [phone].     If you have FMLA/disability paperwork, please drop it off or fax it to [phone], attention [place].   If you have any questions/concerns before or after surgery, you can reach [place] at [phone], or you can send a mychart message. If you have a concern after hours that cannot wait until normal business hours, you can call [phone] or [phone] and ask the answering service to page the neurosurgeon on call.     Appointments/FMLA & disability paperwork: [place] Nurse: Ophelia Shoulder  Medical assistant: Raquel Sarna Physician Assistant's: Cooper Render & Geronimo Boot Surgeon: Meade Maw, MD

## 2022-07-24 ENCOUNTER — Other Ambulatory Visit: Payer: Self-pay

## 2022-07-24 DIAGNOSIS — Z01818 Encounter for other preprocedural examination: Secondary | ICD-10-CM

## 2022-08-17 ENCOUNTER — Encounter
Admission: RE | Admit: 2022-08-17 | Discharge: 2022-08-17 | Disposition: A | Discharge status: Home / Self Care | Payer: Medicare Other | Source: Ambulatory Visit | Attending: Neurosurgery | Admitting: Neurosurgery

## 2022-08-17 DIAGNOSIS — I10 Essential (primary) hypertension: Secondary | ICD-10-CM

## 2022-08-17 DIAGNOSIS — Z01812 Encounter for preprocedural laboratory examination: Secondary | ICD-10-CM

## 2022-08-17 HISTORY — DX: Hyperlipidemia, unspecified: E78.5

## 2022-08-17 HISTORY — DX: Irritable bowel syndrome, unspecified: K58.9

## 2022-08-17 HISTORY — DX: Depression, unspecified: F32.A

## 2022-08-17 HISTORY — DX: Cervical disc disorder, unspecified, unspecified cervical region: M50.90

## 2022-08-17 HISTORY — DX: Anxiety disorder, unspecified: F41.9

## 2022-08-17 HISTORY — DX: Emphysema, unspecified: J43.9

## 2022-08-17 HISTORY — DX: Obstructive sleep apnea (adult) (pediatric): G47.33

## 2022-08-17 HISTORY — DX: Unspecified osteoarthritis, unspecified site: M19.90

## 2022-08-17 HISTORY — DX: Endometriosis, unspecified: N80.9

## 2022-08-17 HISTORY — DX: Atherosclerosis of aorta: I70.0

## 2022-08-17 LAB — CBC
HCT: 36 % (ref 36.0–46.0)
Hemoglobin: 12.1 g/dL (ref 12.0–15.0)
MCH: 34.3 pg — ABNORMAL HIGH (ref 26.0–34.0)
MCHC: 33.6 g/dL (ref 30.0–36.0)
MCV: 102 fL — ABNORMAL HIGH (ref 80.0–100.0)
Platelets: 230 10*3/uL (ref 150–400)
RBC: 3.53 MIL/uL — ABNORMAL LOW (ref 3.87–5.11)
RDW: 13.4 % (ref 11.5–15.5)
WBC: 4.6 10*3/uL (ref 4.0–10.5)
nRBC: 0 % (ref 0.0–0.2)

## 2022-08-17 LAB — BASIC METABOLIC PANEL
Anion gap: 10 (ref 5–15)
BUN: 13 mg/dL (ref 8–23)
CO2: 26 mmol/L (ref 22–32)
Calcium: 9.3 mg/dL (ref 8.9–10.3)
Chloride: 104 mmol/L (ref 98–111)
Creatinine, Ser: 0.73 mg/dL (ref 0.44–1.00)
GFR, Estimated: >60 mL/min (ref >60)
Glucose, Bld: 90 mg/dL (ref 70–99)
Potassium: 3.8 mmol/L (ref 3.5–5.1)
Sodium: 140 mmol/L (ref 135–145)

## 2022-08-17 LAB — URINALYSIS, ROUTINE W REFLEX MICROSCOPIC
Bacteria, UA: NONE SEEN
Bilirubin Urine: NEGATIVE
Glucose, UA: NEGATIVE mg/dL
Hgb urine dipstick: NEGATIVE
Ketones, ur: NEGATIVE mg/dL
Nitrite: NEGATIVE
Protein, ur: NEGATIVE mg/dL
Specific Gravity, Urine: 1.01 (ref 1.005–1.030)
pH: 6 (ref 5.0–8.0)

## 2022-08-17 LAB — SURGICAL PCR SCREEN
MRSA, PCR: NEGATIVE
Staphylococcus aureus: POSITIVE — AB

## 2022-08-17 LAB — TYPE AND SCREEN
ABO/RH(D): AB POS
Antibody Screen: NEGATIVE

## 2022-08-17 NOTE — Patient Instructions (Addendum)
Your procedure is scheduled on: Wednesday, October 18 Report to the Registration Desk on the 1st floor of the [facility]. To find out your arrival time, please call [phone] between 1PM - 3PM on: Tuesday, October 17 If your arrival time is 6:00 am, do not arrive prior to that time as the [facility] entrance doors do not open until 6:00 am.  REMEMBER: Instructions that are not followed completely may result in serious medical risk, up to and including death; or upon the discretion of your surgeon and anesthesiologist your surgery may need to be rescheduled.  Do not eat food after midnight the night before surgery.  No gum chewing, lozengers or hard candies.  You may however, drink CLEAR liquids up to 2 hours before you are scheduled to arrive for your surgery. Do not drink anything within 2 hours of your scheduled arrival time.  Clear liquids include: - water  - apple juice without pulp - gatorade (not RED colors) - black coffee or tea (Do NOT add milk or creamers to the coffee or tea) Do NOT drink anything that is not on this list.  TAKE THESE MEDICATIONS THE MORNING OF SURGERY WITH A SIP OF WATER:  Albuterol inhaler Carvedilol Gabapentin  Use inhalers on the day of surgery and bring to the hospital.  Aspirin - stop 7 days BEFORE surgery and do not start taking again until 14 days AFTER surgery. (Per Dr. Rhea Bleacher instruction)  One week prior to surgery: starting October 11 Stop Anti-inflammatories (NSAIDS) such as Advil, Aleve, Ibuprofen, Motrin, Naproxen, Naprosyn and Aspirin based products such as Excedrin, Goodys Powder, BC Powder. Stop ANY OVER THE COUNTER supplements until after surgery. Stop vitamin D, coenzyme Q10, Krill oil. You may however, continue to take Tylenol if needed for pain up until the day of surgery.  No Alcohol for 24 hours before or after surgery.  No Smoking including e-cigarettes for 24 hours prior to surgery.  No chewable tobacco products  for at least 6 hours prior to surgery.  No nicotine patches on the day of surgery.  Do not use any "recreational" drugs for at least a week prior to your surgery.  Please be advised that the combination of cocaine and anesthesia may have negative outcomes, up to and including death. If you test positive for cocaine, your surgery will be cancelled.  On the morning of surgery brush your teeth with toothpaste and water, you may rinse your mouth with mouthwash if you wish. Do not swallow any toothpaste or mouthwash.  Use CHG Soap as directed on instruction sheet.  Do not wear jewelry, make-up, hairpins, clips or nail polish.  Do not wear lotions, powders, or perfumes.   Do not shave body from the neck down 48 hours prior to surgery just in case you cut yourself which could leave a site for infection.  Also, freshly shaved skin may become irritated if using the CHG soap.  Contact lenses, hearing aids and dentures may not be worn into surgery.  Do not bring valuables to the hospital. [facility] is not responsible for any missing/lost belongings or valuables.   Bring your C-PAP to the hospital with you in case you may have to spend the night.   Notify your doctor if there is any change in your medical condition (cold, fever, infection).  Wear comfortable clothing (specific to your surgery type) to the hospital.  After surgery, you can help prevent lung complications by doing breathing exercises.  Take deep breaths and  cough every 1-2 hours. Your doctor may order a device called an Incentive Spirometer to help you take deep breaths.  If you are being discharged the day of surgery, you will not be allowed to drive home. You will need a responsible adult (18 years or older) to drive you home and stay with you that night.   If you are taking public transportation, you will need to have a responsible adult (18 years or older) with you. Please confirm with your physician that it is acceptable  to use public transportation.   Please call the [facility]. at [phone] if you have any questions about these instructions.  Surgery Visitation Policy:  Patients undergoing a surgery or procedure may have two family members or support persons with them as long as the person is not COVID-19 positive or experiencing its symptoms.      Preparing for Surgery with CHLORHEXIDINE GLUCONATE (CHG) Soap  Chlorhexidine Gluconate (CHG) Soap  o An antiseptic cleaner that kills germs and bonds with the skin to continue killing germs even after washing  o Used for showering the night before surgery and morning of surgery  Before surgery, you can play an important role by reducing the number of germs on your skin.  CHG (Chlorhexidine gluconate) soap is an antiseptic cleanser which kills germs and bonds with the skin to continue killing germs even after washing.  Please do not use if you have an allergy to CHG or antibacterial soaps. If your skin becomes reddened/irritated stop using the CHG.  1. Shower the NIGHT BEFORE SURGERY and the MORNING OF SURGERY with CHG soap.  2. If you choose to wash your hair, wash your hair first as usual with your normal shampoo.  3. After shampooing, rinse your hair and body thoroughly to remove the shampoo.  4. Use CHG as you would any other liquid soap. You can apply CHG directly to the skin and wash gently with a scrungie or a clean washcloth.  5. Apply the CHG soap to your body only from the neck down. Do not use on open wounds or open sores. Avoid contact with your eyes, ears, mouth, and genitals (private parts). Wash face and genitals (private parts) with your normal soap.  6. Wash thoroughly, paying special attention to the area where your surgery will be performed.  7. Thoroughly rinse your body with warm water.  8. Do not shower/wash with your normal soap after using and rinsing off the CHG soap.  9. Pat yourself dry with a clean  towel.  10. Wear clean pajamas to bed the night before surgery.  12. Place clean sheets on your bed the night of your first shower and do not sleep with pets.  13. Shower again with the CHG soap on the day of surgery prior to arriving at the hospital.  14. Do not apply any deodorants/lotions/powders.  15. Please wear clean clothes to the hospital.

## 2022-08-26 ENCOUNTER — Encounter
Admission: RE | Disposition: A | Discharge status: Home / Self Care | Payer: Self-pay | Source: Home / Self Care | Attending: Neurosurgery

## 2022-08-26 ENCOUNTER — Ambulatory Visit: Payer: Medicare Other | Admitting: Urgent Care

## 2022-08-26 ENCOUNTER — Encounter: Payer: Self-pay | Admitting: Neurosurgery

## 2022-08-26 ENCOUNTER — Observation Stay
Admission: RE | Admit: 2022-08-26 | Discharge: 2022-08-26 | Disposition: A | Discharge status: Home / Self Care | Payer: Medicare Other | Attending: Neurosurgery | Admitting: Neurosurgery

## 2022-08-26 ENCOUNTER — Other Ambulatory Visit: Payer: Self-pay

## 2022-08-26 ENCOUNTER — Ambulatory Visit: Payer: Medicare Other

## 2022-08-26 DIAGNOSIS — M5416 Radiculopathy, lumbar region: Secondary | ICD-10-CM

## 2022-08-26 DIAGNOSIS — Z79899 Other long term (current) drug therapy: Secondary | ICD-10-CM | POA: Insufficient documentation

## 2022-08-26 DIAGNOSIS — Z7982 Long term (current) use of aspirin: Secondary | ICD-10-CM | POA: Diagnosis not present

## 2022-08-26 DIAGNOSIS — Z87891 Personal history of nicotine dependence: Secondary | ICD-10-CM | POA: Insufficient documentation

## 2022-08-26 DIAGNOSIS — M5116 Intervertebral disc disorders with radiculopathy, lumbar region: Principal | ICD-10-CM | POA: Insufficient documentation

## 2022-08-26 DIAGNOSIS — Z85828 Personal history of other malignant neoplasm of skin: Secondary | ICD-10-CM | POA: Diagnosis not present

## 2022-08-26 DIAGNOSIS — I10 Essential (primary) hypertension: Secondary | ICD-10-CM | POA: Diagnosis not present

## 2022-08-26 DIAGNOSIS — Z01812 Encounter for preprocedural laboratory examination: Secondary | ICD-10-CM

## 2022-08-26 DIAGNOSIS — Z01818 Encounter for other preprocedural examination: Secondary | ICD-10-CM

## 2022-08-26 LAB — ABO/RH: ABO/RH(D): AB POS

## 2022-08-26 SURGERY — FAR LATERAL DECOMPRESSION 1 LEVEL
Anesthesia: General | Site: Spine Lumbar | Laterality: Left

## 2022-08-26 MED ORDER — REMIFENTANIL HCL 1 MG IV SOLR
INTRAVENOUS | Status: AC
  Filled 2022-08-26: qty 1000

## 2022-08-26 MED ORDER — LIDOCAINE HCL (CARDIAC) PF 100 MG/5ML IV SOSY
PREFILLED_SYRINGE | INTRAVENOUS | Status: DC | PRN
  Administered 2022-08-26: 100 mg via INTRAVENOUS

## 2022-08-26 MED ORDER — PROPOFOL 10 MG/ML IV BOLUS
INTRAVENOUS | Status: DC | PRN
  Administered 2022-08-26: 100 mg via INTRAVENOUS

## 2022-08-26 MED ORDER — EPHEDRINE SULFATE (PRESSORS) 50 MG/ML IJ SOLN
INTRAMUSCULAR | Status: DC | PRN
  Administered 2022-08-26: 10 mg via INTRAVENOUS
  Administered 2022-08-26: 5 mg via INTRAVENOUS
  Administered 2022-08-26: 10 mg via INTRAVENOUS

## 2022-08-26 MED ORDER — ACETAMINOPHEN 10 MG/ML IV SOLN
INTRAVENOUS | Status: AC
  Filled 2022-08-26: qty 100

## 2022-08-26 MED ORDER — FENTANYL CITRATE (PF) 100 MCG/2ML IJ SOLN
INTRAMUSCULAR | Status: AC
  Filled 2022-08-26: qty 2

## 2022-08-26 MED ORDER — BUPIVACAINE-EPINEPHRINE (PF) 0.5% -1:200000 IJ SOLN
INTRAMUSCULAR | Status: AC
  Filled 2022-08-26: qty 30

## 2022-08-26 MED ORDER — BUPIVACAINE HCL (PF) 0.5 % IJ SOLN
INTRAMUSCULAR | Status: AC
  Filled 2022-08-26: qty 30

## 2022-08-26 MED ORDER — EPHEDRINE 5 MG/ML INJ
INTRAVENOUS | Status: AC
  Filled 2022-08-26: qty 5

## 2022-08-26 MED ORDER — DEXAMETHASONE SODIUM PHOSPHATE 10 MG/ML IJ SOLN: 4.0000 mg | Freq: Four times a day (QID) | INTRAMUSCULAR | Status: DC

## 2022-08-26 MED ORDER — MIDAZOLAM HCL 2 MG/2ML IJ SOLN
INTRAMUSCULAR | Status: DC | PRN
  Administered 2022-08-26 (×2): 1 mg via INTRAVENOUS

## 2022-08-26 MED ORDER — SUCCINYLCHOLINE CHLORIDE 200 MG/10ML IV SOSY
PREFILLED_SYRINGE | INTRAVENOUS | Status: AC
  Filled 2022-08-26: qty 10

## 2022-08-26 MED ORDER — CHLORHEXIDINE GLUCONATE 0.12 % MT SOLN
OROMUCOSAL | Status: AC
  Administered 2022-08-26: 15 mL via OROMUCOSAL
  Filled 2022-08-26: qty 15

## 2022-08-26 MED ORDER — FENTANYL CITRATE (PF) 100 MCG/2ML IJ SOLN
25.0000 ug | INTRAMUSCULAR | Status: DC | PRN
  Administered 2022-08-26 (×3): 25 ug via INTRAVENOUS

## 2022-08-26 MED ORDER — DEXAMETHASONE SODIUM PHOSPHATE 10 MG/ML IJ SOLN
INTRAMUSCULAR | Status: AC
  Filled 2022-08-26: qty 1

## 2022-08-26 MED ORDER — DEXAMETHASONE SODIUM PHOSPHATE 10 MG/ML IJ SOLN
INTRAMUSCULAR | Status: DC | PRN
  Administered 2022-08-26: 10 mg via INTRAVENOUS

## 2022-08-26 MED ORDER — ACETAMINOPHEN 10 MG/ML IV SOLN: 1000.0000 mg | Freq: Once | INTRAVENOUS | Status: DC | PRN

## 2022-08-26 MED ORDER — ONDANSETRON HCL 4 MG/2ML IJ SOLN
INTRAMUSCULAR | Status: DC | PRN
  Administered 2022-08-26: 4 mg via INTRAVENOUS

## 2022-08-26 MED ORDER — DEXMEDETOMIDINE HCL IN NACL 80 MCG/20ML IV SOLN
INTRAVENOUS | Status: AC
  Filled 2022-08-26: qty 20

## 2022-08-26 MED ORDER — DROPERIDOL 2.5 MG/ML IJ SOLN: 0.6250 mg | Freq: Once | INTRAMUSCULAR | Status: DC | PRN

## 2022-08-26 MED ORDER — PROMETHAZINE HCL 25 MG/ML IJ SOLN: 6.2500 mg | INTRAMUSCULAR | Status: DC | PRN

## 2022-08-26 MED ORDER — ORAL CARE MOUTH RINSE: 15.0000 mL | Freq: Once | OROMUCOSAL | Status: AC

## 2022-08-26 MED ORDER — PROPOFOL 1000 MG/100ML IV EMUL
INTRAVENOUS | Status: AC
  Filled 2022-08-26: qty 100

## 2022-08-26 MED ORDER — OXYCODONE HCL 5 MG PO TABS
ORAL_TABLET | ORAL | Status: AC
  Filled 2022-08-26: qty 1

## 2022-08-26 MED ORDER — ARTIFICIAL TEARS OPHTHALMIC OINT
TOPICAL_OINTMENT | OPHTHALMIC | Status: AC
  Filled 2022-08-26: qty 3.5

## 2022-08-26 MED ORDER — GLYCOPYRROLATE 0.2 MG/ML IJ SOLN
INTRAMUSCULAR | Status: DC | PRN
  Administered 2022-08-26: .2 mg via INTRAVENOUS

## 2022-08-26 MED ORDER — METHYLPREDNISOLONE ACETATE 40 MG/ML IJ SUSP
INTRAMUSCULAR | Status: AC
  Filled 2022-08-26: qty 1

## 2022-08-26 MED ORDER — FAMOTIDINE 20 MG PO TABS
ORAL_TABLET | ORAL | Status: AC
  Administered 2022-08-26: 20 mg via ORAL
  Filled 2022-08-26: qty 1

## 2022-08-26 MED ORDER — 0.9 % SODIUM CHLORIDE (POUR BTL) OPTIME
TOPICAL | Status: DC | PRN
  Administered 2022-08-26: 500 mL

## 2022-08-26 MED ORDER — SUCCINYLCHOLINE CHLORIDE 200 MG/10ML IV SOSY
PREFILLED_SYRINGE | INTRAVENOUS | Status: DC | PRN
  Administered 2022-08-26: 100 mg via INTRAVENOUS

## 2022-08-26 MED ORDER — METHOCARBAMOL 500 MG PO TABS: 500.0000 mg | ORAL_TABLET | Freq: Four times a day (QID) | ORAL | 0 refills | Status: DC | PRN

## 2022-08-26 MED ORDER — CEFAZOLIN IN SODIUM CHLORIDE 2-0.9 GM/100ML-% IV SOLN
2.0000 g | Freq: Once | INTRAVENOUS | Status: DC
  Filled 2022-08-26: qty 100

## 2022-08-26 MED ORDER — HYDROCODONE-ACETAMINOPHEN 5-325 MG PO TABS: 1.0000 | ORAL_TABLET | ORAL | 0 refills | Status: DC | PRN

## 2022-08-26 MED ORDER — MIDAZOLAM HCL 2 MG/2ML IJ SOLN
INTRAMUSCULAR | Status: AC
  Filled 2022-08-26: qty 2

## 2022-08-26 MED ORDER — SURGIFLO WITH THROMBIN (HEMOSTATIC MATRIX KIT) OPTIME
TOPICAL | Status: DC | PRN
  Administered 2022-08-26: 1 via TOPICAL

## 2022-08-26 MED ORDER — LACTATED RINGERS IV SOLN: INTRAVENOUS | Status: DC

## 2022-08-26 MED ORDER — SODIUM CHLORIDE FLUSH 0.9 % IV SOLN
INTRAVENOUS | Status: AC
  Filled 2022-08-26: qty 20

## 2022-08-26 MED ORDER — OXYCODONE HCL 5 MG/5ML PO SOLN: 5.0000 mg | Freq: Once | ORAL | Status: AC | PRN

## 2022-08-26 MED ORDER — LIDOCAINE HCL (PF) 2 % IJ SOLN
INTRAMUSCULAR | Status: AC
  Filled 2022-08-26: qty 5

## 2022-08-26 MED ORDER — CHLORHEXIDINE GLUCONATE 0.12 % MT SOLN: 15.0000 mL | Freq: Once | OROMUCOSAL | Status: AC

## 2022-08-26 MED ORDER — CEFAZOLIN SODIUM-DEXTROSE 2-4 GM/100ML-% IV SOLN
2.0000 g | INTRAVENOUS | Status: AC
  Administered 2022-08-26: 2 g via INTRAVENOUS

## 2022-08-26 MED ORDER — SEVOFLURANE IN SOLN
RESPIRATORY_TRACT | Status: AC
  Filled 2022-08-26: qty 250

## 2022-08-26 MED ORDER — REMIFENTANIL HCL 1 MG IV SOLR
INTRAVENOUS | Status: DC | PRN
  Administered 2022-08-26: .1 ug/kg/min via INTRAVENOUS

## 2022-08-26 MED ORDER — ACETAMINOPHEN 10 MG/ML IV SOLN
INTRAVENOUS | Status: DC | PRN
  Administered 2022-08-26: 1000 mg via INTRAVENOUS

## 2022-08-26 MED ORDER — PROPOFOL 500 MG/50ML IV EMUL
INTRAVENOUS | Status: DC | PRN
  Administered 2022-08-26: 125 ug/kg/min via INTRAVENOUS

## 2022-08-26 MED ORDER — KETOROLAC TROMETHAMINE 30 MG/ML IJ SOLN
INTRAMUSCULAR | Status: DC | PRN
  Administered 2022-08-26: 30 mg via INTRAVENOUS

## 2022-08-26 MED ORDER — METHYLPREDNISOLONE ACETATE 40 MG/ML IJ SUSP
INTRAMUSCULAR | Status: DC | PRN
  Administered 2022-08-26: 40 mg

## 2022-08-26 MED ORDER — BUPIVACAINE LIPOSOME 1.3 % IJ SUSP
INTRAMUSCULAR | Status: AC
  Filled 2022-08-26: qty 20

## 2022-08-26 MED ORDER — SODIUM CHLORIDE (PF) 0.9 % IJ SOLN
INTRAMUSCULAR | Status: DC | PRN
  Administered 2022-08-26: 60 mL

## 2022-08-26 MED ORDER — FENTANYL CITRATE (PF) 100 MCG/2ML IJ SOLN
INTRAMUSCULAR | Status: DC | PRN
  Administered 2022-08-26: 50 ug via INTRAVENOUS

## 2022-08-26 MED ORDER — KETOROLAC TROMETHAMINE 30 MG/ML IJ SOLN
INTRAMUSCULAR | Status: AC
  Filled 2022-08-26: qty 1

## 2022-08-26 MED ORDER — FENTANYL CITRATE (PF) 100 MCG/2ML IJ SOLN
INTRAMUSCULAR | Status: AC
  Administered 2022-08-26: 25 ug via INTRAVENOUS
  Filled 2022-08-26: qty 2

## 2022-08-26 MED ORDER — FAMOTIDINE 20 MG PO TABS: 20.0000 mg | ORAL_TABLET | Freq: Once | ORAL | Status: AC

## 2022-08-26 MED ORDER — DEXMEDETOMIDINE HCL IN NACL 200 MCG/50ML IV SOLN
INTRAVENOUS | Status: DC | PRN
  Administered 2022-08-26: 4 ug via INTRAVENOUS

## 2022-08-26 MED ORDER — BUPIVACAINE-EPINEPHRINE (PF) 0.5% -1:200000 IJ SOLN
INTRAMUSCULAR | Status: DC | PRN
  Administered 2022-08-26: 4 mL

## 2022-08-26 MED ORDER — CEFAZOLIN SODIUM-DEXTROSE 2-4 GM/100ML-% IV SOLN
INTRAVENOUS | Status: AC
  Filled 2022-08-26: qty 100

## 2022-08-26 MED ORDER — ONDANSETRON HCL 4 MG/2ML IJ SOLN
INTRAMUSCULAR | Status: AC
  Filled 2022-08-26: qty 2

## 2022-08-26 MED ORDER — OXYCODONE HCL 5 MG PO TABS
5.0000 mg | ORAL_TABLET | Freq: Once | ORAL | Status: AC | PRN
  Administered 2022-08-26: 5 mg via ORAL

## 2022-08-26 SURGICAL SUPPLY — 45 items
BASIN KIT SINGLE STR (MISCELLANEOUS) ×1 IMPLANT
BUR NEURO DRILL SOFT 3.0X3.8M (BURR) ×1 IMPLANT
CHLORAPREP W/TINT 26 (MISCELLANEOUS) ×1 IMPLANT
CNTNR SPEC 2.5X3XGRAD LEK (MISCELLANEOUS) ×1
CONT SPEC 4OZ STER OR WHT (MISCELLANEOUS) ×1
CONT SPEC 4OZ STRL OR WHT (MISCELLANEOUS) ×1
CONTAINER SPEC 2.5X3XGRAD LEK (MISCELLANEOUS) ×1 IMPLANT
DERMABOND ADVANCED .7 DNX12 (GAUZE/BANDAGES/DRESSINGS) ×1 IMPLANT
DRAPE C ARM PK CFD 31 SPINE (DRAPES) ×1 IMPLANT
DRAPE LAPAROTOMY 100X77 ABD (DRAPES) ×1 IMPLANT
DRAPE MICROSCOPE SPINE 48X150 (DRAPES) ×1 IMPLANT
DRAPE SURG 17X11 SM STRL (DRAPES) ×1 IMPLANT
DRSG OPSITE POSTOP 3X4 (GAUZE/BANDAGES/DRESSINGS) IMPLANT
ELECT EZSTD 165MM 6.5IN (MISCELLANEOUS)
ELECT REM PT RETURN 9FT ADLT (ELECTROSURGICAL) ×1
ELECTRODE EZSTD 165MM 6.5IN (MISCELLANEOUS) IMPLANT
ELECTRODE REM PT RTRN 9FT ADLT (ELECTROSURGICAL) ×1 IMPLANT
GLOVE BIOGEL PI IND STRL 6.5 (GLOVE) ×1 IMPLANT
GLOVE BIOGEL PI IND STRL 8.5 (GLOVE) ×1 IMPLANT
GLOVE SURG SYN 6.5 ES PF (GLOVE) ×2 IMPLANT
GLOVE SURG SYN 6.5 PF PI (GLOVE) ×2 IMPLANT
GLOVE SURG SYN 8.5  E (GLOVE) ×3
GLOVE SURG SYN 8.5 E (GLOVE) ×3 IMPLANT
GLOVE SURG SYN 8.5 PF PI (GLOVE) ×3 IMPLANT
GOWN SRG LRG LVL 4 IMPRV REINF (GOWNS) ×1 IMPLANT
GOWN SRG XL LVL 3 NONREINFORCE (GOWNS) ×1 IMPLANT
GOWN STRL NON-REIN TWL XL LVL3 (GOWNS) ×1
GOWN STRL REIN LRG LVL4 (GOWNS) ×1
KIT SPINAL PRONEVIEW (KITS) ×1 IMPLANT
MANIFOLD NEPTUNE II (INSTRUMENTS) ×1 IMPLANT
MARKER SKIN DUAL TIP RULER LAB (MISCELLANEOUS) ×1 IMPLANT
NDL SAFETY ECLIP 18X1.5 (MISCELLANEOUS) ×1 IMPLANT
NS IRRIG 1000ML POUR BTL (IV SOLUTION) ×1 IMPLANT
PACK LAMINECTOMY NEURO (CUSTOM PROCEDURE TRAY) ×1 IMPLANT
PAD ARMBOARD 7.5X6 YLW CONV (MISCELLANEOUS) ×1 IMPLANT
SURGIFLO W/THROMBIN 8M KIT (HEMOSTASIS) ×1 IMPLANT
SUT DVC VLOC 3-0 CL 6 P-12 (SUTURE) ×1 IMPLANT
SUT VIC AB 0 CT1 27 (SUTURE) ×1
SUT VIC AB 0 CT1 27XCR 8 STRN (SUTURE) ×1 IMPLANT
SUT VIC AB 2-0 CT1 18 (SUTURE) ×1 IMPLANT
SYR 10ML LL (SYRINGE) ×2 IMPLANT
SYR 30ML LL (SYRINGE) ×2 IMPLANT
SYR 3ML LL SCALE MARK (SYRINGE) ×1 IMPLANT
TRAP FLUID SMOKE EVACUATOR (MISCELLANEOUS) ×1 IMPLANT
WATER STERILE IRR 1000ML POUR (IV SOLUTION) ×2 IMPLANT

## 2022-08-26 NOTE — Progress Notes (Signed)
Attempted to ambulate pt.  Gait unsteady.  Pt feels like left leg is going to give out.  Has numbness in left leg which was present preop but denies having difficult ambulating.  Let pt rest and tried again but sx remain same.  Strength at this time equal to right and left plantar and dorsiflexion, knee extension and flexion, and hip flexion.  Called into OR to inform dr Izora Ribas of gait difficulty.  He will come see pt after current surgery.     1255- pt needs to use bathroom.  Upon standing to get in wheelchair, pt started to lean towards left, feels like left "hip is buckling".

## 2022-08-26 NOTE — H&P (Signed)
History of Present Illness: 08/26/2022 [name] continues to have pain in her left leg.  07/23/2022 [name] returns to see me with a return left leg pain similar to that noted in my prior notes.   06/04/2022 [name] is doing better since last time I saw her.  She is able to do everything she needs to do.  She is no longer doing physical therapy or exercises.     03/02/2022 [name] is here today with a chief complaint of low back pain that radiates into the bilateral legs with the left leg much worse than the right. She is also complaining of numbness, tingling and weakness in the left leg.   She has been having pain as bad as 10 out of 10 with walking and sitting. Her left leg is much worse than the rest. Standing as well as heat and ice make it better.  She is doing some physical therapy which is helped her, though she has moderated her activity.  Bowel/Bladder Dysfunction: none  Conservative measures:  Physical therapy: currently participating in at [facility] including regular antiinflammatories: gabapentin, hydrocodone, tizanidine Injections: has had epidural steroid injections 12/19/21: Bilateral S1 ESI (no relief) 07/28/21: Bilateral S1 ESI (no relief) 07/07/21: Bilateral L5-S1 ESI( 4 months of relief)   Past Surgery:  Cervical Fusion in [place] has no symptoms of cervical myelopathy.  The symptoms are causing a significant impact on the patient's life.    Review of Systems:  A 10 point review of systems is negative, except for the pertinent positives and negatives detailed in the HPI.   Past Medical History:     Past Medical History:  Diagnosis Date   Basal cell carcinoma 04/25/2018    R mid infraclavicular   Cancer (HCC)      skin ca   Fibromyalgia     GERD (gastroesophageal reflux disease)     Hypertension     Interstitial cystitis        Past Surgical History:      Past Surgical  History:  Procedure Laterality Date   ABDOMINAL HYSTERECTOMY       CARPAL TUNNEL RELEASE Bilateral     CERVICAL FUSION       CHOLECYSTECTOMY       OOPHORECTOMY          Allergies: Allergies  Allergen Reactions   Amlodipine Swelling    Leg edema   Hydrochlorothiazide Photosensitivity         Medications: Current Meds  Medication Sig   albuterol (VENTOLIN HFA) 108 (90 Base) MCG/ACT inhaler Inhale 2 puffs into the lungs every 4 (four) hours as needed for wheezing or shortness of breath.   amitriptyline (ELAVIL) 50 MG tablet Take 50 mg by mouth at bedtime.   aspirin EC 81 MG tablet Take 81 mg by mouth daily. Swallow whole.   atorvastatin (LIPITOR) 10 MG tablet Take 10 mg by mouth at bedtime.   carvedilol (COREG) 3.125 MG tablet Take 3.125 mg by mouth 2 (two) times daily.   cloNIDine (CATAPRES) 0.1 MG tablet Take 0.1 mg by mouth 2 (two) times daily as needed.   colestipol (COLESTID) 1 g tablet Take 1 g by mouth daily.   FLONASE ALLERGY RELIEF 50 MCG/ACT nasal spray Place 2 sprays into both nostrils daily.   gabapentin (NEURONTIN) 300 MG capsule Take 3 capsules by mouth in the morning and at bedtime.   Lactobacillus-Inulin ([place]) CAPS Take 1  capsule by mouth daily.   lisinopril (ZESTRIL) 40 MG tablet Take 40 mg by mouth daily.   Meth-Hyo-M Bl-Na Phos-Ph Sal (URIBEL) 118 MG CAPS Take 1 capsule by mouth 4 (four) times daily as needed.   RABEprazole (ACIPHEX) 20 MG tablet Take 1 tablet by mouth daily.   tiZANidine (ZANAFLEX) 4 MG tablet Take 4 mg by mouth at bedtime as needed.   torsemide (DEMADEX) 5 MG tablet Take 5 mg by mouth daily.    Social History: Social History         Tobacco Use   Smoking status: Former      Packs/day: 0.75      Years: 40.00      Total pack years: 30.00      Types: Cigarettes      Quit date: 06/10/2020      Years since quitting: 2.1   Smokeless tobacco: Never   Tobacco comments:      Uses vape w/ nicotine  Vaping Use    Vaping Use: Every day   Devices: w/ nicotine  Substance Use Topics   Alcohol use: Yes      Comment: social   Drug use: Never      Family Medical History:      Family History  Problem Relation Age of Onset   Breast cancer Mother 43   Bladder Cancer Mother     Hypertension Mother     Thyroid disease Mother     Lung cancer Father     Heart disease Father     COPD Father        Physical Examination: Vitals:   08/26/22 0636  BP: 90/62  Pulse: (!) 51  Resp: 16  Temp: 97.8 F (36.6 C)  SpO2: 98%    Heart sounds normal no MRG. Chest Clear to Auscultation Bilaterally.  5/5 throughout BLE SILT    Medical Decision Making   Imaging: MRI L spine 01/11/22 IMPRESSION:  1. L5-S1 moderate left and mild right neural foraminal narrowing,  unchanged from the prior exam. The left lateral aspect of the disc  contacts the exiting left L5 nerve.  2. L4-L5 mild bilateral neural foraminal narrowing, unchanged from  the prior exam.   Electronically Signed    By: Merilyn Baba M.D.    On: 01/12/2022 03:57   I have personally reviewed the images and agree with the above interpretation.   Assessment and Plan: Alice Le is a pleasant 61 y.o. female with left L5 radiculopathy due to foraminal compression in the L5-S1 foramen on the left.  This is due to combination of foraminal narrowing and disc herniation.   She has tried and failed conservative management including physical therapy and injections.  At this point, we will proceed with left-sided L5-S1 far lateral discectomy is indicated.            Micael Barb K. Izora Ribas MD, [facility] Neurosurgery

## 2022-08-26 NOTE — Transfer of Care (Signed)
Immediate Anesthesia Transfer of Care Note  Patient: Alice Le  Procedure(s) Performed: LEFT L5-S1 FAR LATERAL DISCECTOMY (Left: Spine Lumbar)  Patient Location: PACU  Anesthesia Type:General  Level of Consciousness: awake, alert  and oriented  Airway & Oxygen Therapy: Patient Spontanous Breathing and Patient connected to face mask oxygen  Post-op Assessment: Report given to RN and Post -op Vital signs reviewed and stable  Post vital signs: Reviewed and stable  Last Vitals:  Vitals Value Taken Time  BP 126/102 08/26/22 0900  Temp    Pulse 67 08/26/22 0901  Resp 19 08/26/22 0901  SpO2 100 % 08/26/22 0901  Vitals shown include unvalidated device data.  Last Pain:  Vitals:   08/26/22 0636  TempSrc: Oral  PainSc: 6          Complications: No notable events documented.

## 2022-08-26 NOTE — Discharge Summary (Signed)
Physician Discharge Summary  Patient ID: Alice Le MRN: [id number] DOB/AGE: 17-Mar-1961 61 y.o.  Admit date: 08/26/2022 Discharge date: 08/26/2022  Admission Diagnoses:  Discharge Diagnoses:  Active Problems:   Lumbar radiculopathy   Discharged Condition: good  Hospital Course: She was admitted on 08/26/22 for Left L5/S1 Far lateral discectomy and foraminotomy. Procedure went well and she was found stable for discharge home.   She was discharged on norco 5/325 and robaxin.   Consults: None  Significant Diagnostic Studies: radiology: X-Ray: for operative localization  Treatments: surgery: Left L5/S1 Far lateral discectomy and foraminotomy   Discharge Exam: Blood pressure 90/62, pulse (!) 51, temperature 97.8 F (36.6 C), temperature source Oral, resp. rate 16, height '5\' 2"'$  (1.575 m), weight 73.9 kg, SpO2 98 %. She is alert.   Moving all 4 extremities.   Dressing is C/D/I  Disposition: Discharge disposition: 01-Home or Self Care       Discharge Instructions     Discharge patient   Complete by: As directed    Discharge disposition: 01-Home or Self Care   Discharge patient date: 08/26/2022      Allergies as of 08/26/2022       Reactions   Amlodipine Swelling   Leg edema   Hydrochlorothiazide Photosensitivity        Medication List     STOP taking these medications    aspirin EC 81 MG tablet   tiZANidine 4 MG tablet Commonly known as: ZANAFLEX       TAKE these medications    albuterol 108 (90 Base) MCG/ACT inhaler Commonly known as: VENTOLIN HFA Inhale 2 puffs into the lungs every 4 (four) hours as needed for wheezing or shortness of breath.   amitriptyline 50 MG tablet Commonly known as: ELAVIL Take 50 mg by mouth at bedtime.   atorvastatin 10 MG tablet Commonly known as: LIPITOR Take 10 mg by mouth at bedtime.   carvedilol 3.125 MG tablet Commonly known as: COREG Take 3.125 mg by mouth 2 (two) times daily.   cloNIDine 0.1  MG tablet Commonly known as: CATAPRES Take 0.1 mg by mouth 2 (two) times daily as needed.   colestipol 1 g tablet Commonly known as: COLESTID Take 1 g by mouth daily.   CoQ10 200 MG Caps Take 1 capsule by mouth daily.   Culturelle Digestive Health Caps Take 1 capsule by mouth daily.   Flonase Allergy Relief 50 MCG/ACT nasal spray Generic drug: fluticasone Place 2 sprays into both nostrils daily.   gabapentin 300 MG capsule Commonly known as: NEURONTIN Take 3 capsules by mouth in the morning and at bedtime.   HYDROcodone-acetaminophen 5-325 MG tablet Commonly known as: Norco Take 1 tablet by mouth every 4 (four) hours as needed for moderate pain.   Krill Oil 1000 MG Caps Take 1 tablet by mouth daily.   lisinopril 40 MG tablet Commonly known as: ZESTRIL Take 40 mg by mouth daily.   loratadine 10 MG tablet Commonly known as: CLARITIN Take 10 mg by mouth daily.   methocarbamol 500 MG tablet Commonly known as: ROBAXIN Take 1 tablet (500 mg total) by mouth every 6 (six) hours as needed for muscle spasms.   RABEprazole 20 MG tablet Commonly known as: ACIPHEX Take 1 tablet by mouth daily.   torsemide 5 MG tablet Commonly known as: DEMADEX Take 5 mg by mouth daily.   Uribel 118 MG Caps Take 1 capsule by mouth 4 (four) times daily as needed.   Vitamin D3 10 MCG (400  UNIT) Caps Take 1 capsule by mouth daily.        Follow-up Information     Geronimo Boot, PA-C Follow up.   Specialty: Neurosurgery Why: Follow up as scheduled on 09/10/22. Contact information: [address] [place] [address] [phone]                 Signed: Jannett Celestine 08/26/2022, 8:58 AM

## 2022-08-26 NOTE — Anesthesia Postprocedure Evaluation (Signed)
Anesthesia Post Note  Patient: Alice Le  Procedure(s) Performed: LEFT L5-S1 FAR LATERAL DISCECTOMY (Left: Spine Lumbar)  Patient location during evaluation: PACU Anesthesia Type: General Level of consciousness: awake and alert, oriented and patient cooperative Pain management: pain level controlled Vital Signs Assessment: post-procedure vital signs reviewed and stable Respiratory status: spontaneous breathing, nonlabored ventilation and respiratory function stable Cardiovascular status: blood pressure returned to baseline and stable Postop Assessment: adequate PO intake Anesthetic complications: no   No notable events documented.   Last Vitals:  Vitals:   08/26/22 1000 08/26/22 1015  BP: 131/78 132/80  Pulse: (!) 57 (!) 59  Resp: (!) 26   Temp: (!) 36.1 C (!) 36.1 C  SpO2: 99% 99%    Last Pain:  Vitals:   08/26/22 1015  TempSrc:   PainSc: [name]

## 2022-08-26 NOTE — Progress Notes (Signed)
Pt decided she would like to go home using a walker she has available at home.  Gait is better with support of walker.  Dr Izora Ribas is ok with this plan.

## 2022-08-26 NOTE — Discharge Instructions (Addendum)
Your surgeon has performed an operation on your lumbar spine (low back) to relieve pressure on one or more nerves. Many times, patients feel better immediately after surgery and can "overdo it." Even if you feel well, it is important that you follow these activity guidelines. If you do not let your back heal properly from the surgery, you can increase the chance of a disc herniation and/or return of your symptoms. The following are instructions to help in your recovery once you have been discharged from the hospital.  * It is ok to take NSAIDs after surgery.  * You can restart your aspirin at 5 days after surgery. *  Activity    No bending, lifting, or twisting ("BLT"). Avoid lifting objects heavier than 10 pounds (gallon milk jug).  Where possible, avoid household activities that involve lifting, bending, pushing, or pulling such as laundry, vacuuming, grocery shopping, and childcare. Try to arrange for help from friends and family for these activities while your back heals.  Increase physical activity slowly as tolerated.  Taking short walks is encouraged, but avoid strenuous exercise. Do not jog, run, bicycle, lift weights, or participate in any other exercises unless specifically allowed by your doctor. Avoid prolonged sitting, including car rides.  Talk to your doctor before resuming sexual activity.  You should not drive until cleared by your doctor.  Until released by your doctor, you should not return to work or school.  You should rest at home and let your body heal.   You may shower two days after your surgery.  After showering, lightly dab your incision dry. Do not take a tub bath or go swimming for 3 weeks, or until approved by your doctor at your follow-up appointment.  If you smoke, we strongly recommend that you quit.  Smoking has been proven to interfere with normal healing in your back and will dramatically reduce the success rate of your surgery. Please contact [facility]  (800-QUIT-NOW) and use the resources at www.[facility].com for assistance in stopping smoking.  Surgical Incision   If you have a dressing on your incision, you may remove it three days after your surgery. Keep your incision area clean and dry.  You have Dermabond glue over your incision. The glue should begin to peel away within about a week.   Diet            You may return to your usual diet. Be sure to stay hydrated.  When to Contact Alice Le  Although your surgery and recovery will likely be uneventful, you may have some residual numbness, aches, and pains in your back and/or legs. This is normal and should improve in the next few weeks.  However, should you experience any of the following, contact Alice Le immediately: New numbness or weakness Pain that is progressively getting worse, and is not relieved by your pain medications or rest Bleeding, redness, swelling, pain, or drainage from surgical incision Chills or flu-like symptoms Fever greater than 101.0 F (38.3 C) Problems with bowel or bladder functions Difficulty breathing or shortness of breath Warmth, tenderness, or swelling in your calf  Contact Information During office hours (Monday-Friday 9 am to 5 pm), please call your physician at [phone] and ask for Berdine Addison After hours and weekends, please call [phone] and speak with the neurosurgeon on call For a life-threatening emergency, call [place]   The drugs that you were given will stay in your system until tomorrow so for the next  24 hours you should not:  Drive an automobile Make any legal decisions Drink any alcoholic beverage   You may resume regular meals tomorrow.  Today it is better to start with liquids and gradually work up to solid foods.  You may eat anything you prefer, but it is better to start with liquids, then soup and crackers, and gradually work up to solid foods.   Please notify your doctor  immediately if you have any unusual bleeding, trouble breathing, redness and pain at the surgery site, drainage, fever, or pain not relieved by medication.     Additional Instructions:

## 2022-08-26 NOTE — Anesthesia Procedure Notes (Signed)
Procedure Name: Intubation Date/Time: 08/26/2022 7:31 AM  Performed by: Debe Coder, CRNAPre-anesthesia Checklist: Patient identified, Emergency Drugs available, Suction available and Patient being monitored Patient Re-evaluated:Patient Re-evaluated prior to induction Oxygen Delivery Method: Circle system utilized Preoxygenation: Pre-oxygenation with 100% oxygen Induction Type: IV induction Ventilation: Mask ventilation without difficulty Laryngoscope Size: McGraph and 3 Grade View: Grade II Tube type: Oral Tube size: 7.0 mm Number of attempts: 1 Airway Equipment and Method: Stylet and Oral airway Placement Confirmation: ETT inserted through vocal cords under direct vision, positive ETCO2 and breath sounds checked- equal and bilateral Secured at: 21 cm Tube secured with: Tape Dental Injury: Teeth and Oropharynx as per pre-operative assessment

## 2022-08-26 NOTE — Anesthesia Preprocedure Evaluation (Addendum)
Anesthesia Evaluation  Patient identified by MRN, date of birth, ID band Patient awake    Reviewed: Allergy & Precautions, NPO status , Patient's Chart, lab work & pertinent test results, reviewed documented beta blocker date and time   Airway Mallampati: III  TM Distance: >3 FB Neck ROM: full    Dental no notable dental hx.    Pulmonary former smoker,  pulm emphysema   Pulmonary exam normal        Cardiovascular hypertension, Pt. on medications and Pt. on home beta blockers Normal cardiovascular exam     Neuro/Psych PSYCHIATRIC DISORDERS Anxiety CERVICAL FUSION 1974   Neuromuscular disease    GI/Hepatic Neg liver ROS, GERD  Controlled and Medicated,  Endo/Other  negative endocrine ROS  Renal/GU negative Renal ROS     Musculoskeletal  (+) Arthritis , Fibromyalgia -  Abdominal Normal abdominal exam  (+)   Peds  Hematology negative hematology ROS (+)   Anesthesia Other Findings Past Medical History: No date: Anxiety No date: Aortic atherosclerosis (HCC) No date: Arthritis 04/25/2018: Basal cell carcinoma     Comment:  R mid infraclavicular No date: Cancer ([place])     Comment:  skin ca No date: Cervical disc disease No date: Depression No date: Endometriosis No date: Fibromyalgia No date: GERD (gastroesophageal reflux disease) No date: Hyperlipidemia No date: Hypertension No date: Interstitial cystitis No date: Irritable bowel syndrome (IBS) No date: Obstructive sleep apnea on CPAP 06/25/2020: Pneumonia No date: Pulmonary emphysema ([place])  Past Surgical History: No date: ABDOMINAL HYSTERECTOMY No date: ABDOMINOPLASTY 12/13/2017: CARPAL TUNNEL RELEASE; Left 11/15/2019: CARPAL TUNNEL RELEASE; Right 1974: CERVICAL FUSION 1986: CESAREAN SECTION 1988: CESAREAN SECTION No date: CHOLECYSTECTOMY 01/08/2014: COLONOSCOPY No date: LYSIS OF ADHESION No date: OOPHORECTOMY No date: TONSILLECTOMY  BMI    Body  Mass Index: 29.80 kg/m      Reproductive/Obstetrics negative OB ROS                            Anesthesia Physical Anesthesia Plan  ASA: 2  Anesthesia Plan: General ETT   Post-op Pain Management: Ofirmev IV (intra-op)* and Toradol IV (intra-op)*   Induction: Intravenous  PONV Risk Score and Plan: 3 and Ondansetron, Dexamethasone and Midazolam  Airway Management Planned: Oral ETT  Additional Equipment:   Intra-op Plan:   Post-operative Plan: Extubation in OR  Informed Consent: I have reviewed the patients History and Physical, chart, labs and discussed the procedure including the risks, benefits and alternatives for the proposed anesthesia with the patient or authorized representative who has indicated his/her understanding and acceptance.     Dental Advisory Given  Plan Discussed with: Anesthesiologist, CRNA and Surgeon  Anesthesia Plan Comments:        Anesthesia Quick Evaluation

## 2022-08-26 NOTE — Op Note (Signed)
Indications: The patient is a 61 yo female who presented with lumbar radiculopathy who failed conservative management.  Findings: far lateral disc herniation at L5/S1.  Preoperative Diagnosis: Lumbar radiculopathy Postoperative Diagnosis: same   EBL: 10 ml IVF: see AR ml Drains: none Disposition: Extubated and Stable to PACU Complications: none  No foley catheter was placed.   Preoperative Note:   Risks of surgery discussed include: infection, bleeding, stroke, coma, death, paralysis, CSF leak, nerve/spinal cord injury, numbness, tingling, weakness, complex regional pain syndrome, recurrent stenosis and/or disc herniation, vascular injury, development of instability, neck/back pain, need for further surgery, persistent symptoms, development of deformity, and the risks of anesthesia. The patient understood these risks and agreed to proceed.  Operative Note:   1) Left L5/S1 Far lateral discectomy and foraminotomy  The patient was then brought from the preoperative center with intravenous access established.  The patient underwent general anesthesia and endotracheal tube intubation, and was then rotated on the [place] rail top where all pressure points were appropriately padded.  The skin was then thoroughly cleansed.  Perioperative antibiotic prophylaxis was administered.  Sterile prep and drapes were then applied and a timeout was then observed.  C-arm was brought into the field under sterile conditions, and the L5/S1 disc space identified and marked with an incision on the left ~4cm lateral to midline.   Once this was complete a 2 cm incision was opened with the use of a #10 blade knife.  The Metrx tubes were sequentially advanced under lateral fluoroscopy until a 18 x 80 mm Metrx tube was placed over the S1 transverse process and secured to the bed.    The microscope was then sterilely brought into the field and muscle creep was hemostased with a bipolar and resected with a pituitary  rongeur.  A Bovie extender was then used to expose the transverse process and lateral facet.  Careful attention was placed to not violate the facet capsule. A 3 mm matchstick drill bit was then used to drill off the top of the sacral ala, lateral L5/S1 facet, and lateral pars.      The superior aspect of the S1 pedicle was identified and exposed down to its junction with the vertebral body.  The disc was identified. The disc was entered using a small Penfield 4. The L5 nerve root was identified and freed using a Penfield 4 and balltip probe.  The disc herniation was identified and dissected free using a balltip probe. The pituitary rongeur was used to remove the extruded disc fragments. Once the nerve root were noted to be relaxed and under less tension the ball-tipped feeler was passed along the foramen proximally to to ensure no residual compression was noted.    Depo-Medrol was placed along the nerve root.  The area was irrigated. The tube system was then removed under microscopic visualization and hemostasis was obtained with a bipolar.    The fascial layer was reapproximated with the use of a 0- Vicryl suture.  Subcutaneous tissue layer was reapproximated using 2-0 Vicryl suture.  3-0 monocryl was used on the skin. The skin was then cleansed and Dermabond was used to close the skin opening.  Patient was then rotated back to the preoperative bed awakened from anesthesia and taken to recovery all counts are correct in this case.   I performed the entire procedure with the assistance of stacy Wynonia Musty PA as an Pensions consultant. An assistant was required for this procedure due to the complexity.  The assistant provided  assistance in tissue manipulation and suction, and was required for the successful and safe performance of the procedure. I performed the critical portions of the procedure.    Meade Maw MD

## 2022-08-31 ENCOUNTER — Ambulatory Visit: Payer: Medicare Other | Admitting: Dermatology

## 2022-09-09 NOTE — Progress Notes (Unsigned)
   REFERRING PHYSICIAN:  Sofie Hartigan, Md [address] [place],  [place] [address]  DOS: 08/26/22 Left L5/S1 Far lateral discectomy and foraminotomy   HISTORY OF PRESENT ILLNESS: Alice Le is approximately 2 weeks status post Left L5/S1 Far lateral discectomy and foraminotomy . Was given norco 5 and robaxin on discharge from the hospital.   She was doing well until Sunday when her preop left leg pain returned. Pain was severe the last few days. Some improvement today. Not much relief with norco or robaxin.   She also has FM and has noted increased pain with the recent cold weather.    PHYSICAL EXAMINATION:  General: Patient is well developed, well nourished, calm, collected, and in no apparent distress.   NEUROLOGICAL:  General: In no acute distress.   Awake, alert, oriented to person, place, and time.  Pupils equal round and reactive to light.  Facial tone is symmetric.     Strength:            Side Iliopsoas Quads Hamstring PF DF EHL  R '5 5 5 5 5 5  '$ L '5 5 5 5 5 5   '$ Incision c/d/i   ROS (Neurologic):  Negative except as noted above  IMAGING: Nothing new to review.   ASSESSMENT/PLAN:  TULIP MEHARG was doing well s/p above surgery until recent flare of left leg pain. Treatment options reviewed with patient and following plan made:   - We discussed postop flare ups after lateral discectomy. She was reassured that her pain should improve.  - Medrol dose pack sent to pharmacy. Reviewed dosing and side effects.  - I have advised the patient to lift up to 10 pounds until 6 weeks after surgery (follow up with Dr. Izora Ribas).  - Reviewed wound care.  - No bending, twisting, or lifting.  - Continue on current medications including prn norco and robaxin. Continue on her chronic neurontin.   - Follow up as scheduled in 4 weeks and prn.  - Will message her next week to check on her progress.   Advised to contact the office if any questions or concerns  arise.  Geronimo Boot PA-C Department of neurosurgery

## 2022-09-10 ENCOUNTER — Encounter: Payer: Self-pay | Admitting: Orthopedic Surgery

## 2022-09-10 ENCOUNTER — Ambulatory Visit (INDEPENDENT_AMBULATORY_CARE_PROVIDER_SITE_OTHER): Payer: Medicare Other | Admitting: Orthopedic Surgery

## 2022-09-10 VITALS — BP 153/88 | HR 70 | Temp 98.1°F | Ht 62.0 in | Wt 162.9 lb

## 2022-09-10 DIAGNOSIS — Z9889 Other specified postprocedural states: Secondary | ICD-10-CM

## 2022-09-10 DIAGNOSIS — M5416 Radiculopathy, lumbar region: Secondary | ICD-10-CM

## 2022-09-10 DIAGNOSIS — Z09 Encounter for follow-up examination after completed treatment for conditions other than malignant neoplasm: Secondary | ICD-10-CM

## 2022-09-10 MED ORDER — METHYLPREDNISOLONE 4 MG PO TBPK: ORAL_TABLET | ORAL | 0 refills | Status: DC

## 2022-09-14 ENCOUNTER — Telehealth: Payer: Self-pay

## 2022-09-14 NOTE — Telephone Encounter (Signed)
Opened in error

## 2022-09-15 ENCOUNTER — Telehealth: Payer: Self-pay

## 2022-09-15 NOTE — Telephone Encounter (Signed)
-----   Message from Peggyann Shoals sent at 09/14/2022  9:56 AM EST ----- Contact: [phone] Left L5-S1 far lateral discectomy on 08/26/22 Sh was seen on 11/2 and did tell Marzetta Board that she was still having pain in her buttock down into left leg. Sharp pain from knee to ankle started today. What can Marzetta Board recommend?

## 2022-09-16 NOTE — Telephone Encounter (Signed)
Is she still taking her neurontin? If not, let me know. If so, follow below plan.    If she's taking '300mg'$  tid, then have her increase to '300mg'$  in am, '300mg'$  at lunch, and '600mg'$  at night for 3 days.   If she does okay with this (after 3 days), have her do '300mg'$  in the am, '600mg'$  at lunch, and '600mg'$  at night.   This should help with her pain.

## 2022-09-16 NOTE — Telephone Encounter (Signed)
Patient calling again. Can she please get a response from [facility] today. She is still having pain, like a burning feeling.

## 2022-09-16 NOTE — Telephone Encounter (Signed)
I spoke with the patient to find out what all she was taking. She states that she is taking the hydrocodone every 4 hours, robaxin every 6 hours and she has finished her medrol dosepak and nothing is helping. Is there something else you can recommend?

## 2022-09-17 NOTE — Telephone Encounter (Signed)
I spoke with the patient and she stated that she has been taking the gabapentin only 2 times day, I discussed this with Marzetta Board and she would like her to increase the Gabapentin to 300 mg, 3 times a day. I told her to notify us next week if she is no better. She verbalized understanding.

## 2022-10-08 ENCOUNTER — Encounter: Payer: Self-pay | Admitting: Neurosurgery

## 2022-10-08 ENCOUNTER — Ambulatory Visit (INDEPENDENT_AMBULATORY_CARE_PROVIDER_SITE_OTHER): Payer: Medicare Other | Admitting: Neurosurgery

## 2022-10-08 VITALS — BP 126/76 | Temp 97.7°F | Ht 62.0 in | Wt 162.0 lb

## 2022-10-08 DIAGNOSIS — Z09 Encounter for follow-up examination after completed treatment for conditions other than malignant neoplasm: Secondary | ICD-10-CM

## 2022-10-08 DIAGNOSIS — M5416 Radiculopathy, lumbar region: Secondary | ICD-10-CM

## 2022-10-08 NOTE — Progress Notes (Signed)
   REFERRING PHYSICIAN:  Sofie Hartigan, Md [address] [place],  [place] [address]  DOS: 08/26/22 Left L5/S1 Far lateral discectomy and foraminotomy   HISTORY OF PRESENT ILLNESS: Alice Le is status post Left L5/S1 Far lateral discectomy and foraminotomy.  Her pain is approximately 50% improved compared to prior to surgery.  She still has some nerve type pain in her left lower leg and anterolateral calf.   PHYSICAL EXAMINATION:  General: Patient is well developed, well nourished, calm, collected, and in no apparent distress.   NEUROLOGICAL:  General: In no acute distress.   Awake, alert, oriented to person, place, and time.  Pupils equal round and reactive to light.  Facial tone is symmetric.     Strength:            Side Iliopsoas Quads Hamstring PF DF EHL  R '5 5 5 5 5 5  '$ L '5 5 5 5 5 5   '$ Incision c/d/i   ROS (Neurologic):  Negative except as noted above  IMAGING: Nothing new to review.   ASSESSMENT/PLAN:  Alice Le was doing well s/p above surgery.  She did suffer from a post decompression flare.  That has fortunately improved over time.  I think she will continue to improve.  I will see her back in approximately 6 weeks.  We reviewed her activity limitations.  Meade Maw, MD Department of neurosurgery

## 2022-11-18 ENCOUNTER — Ambulatory Visit: Payer: Medicare Other | Admitting: Dermatology

## 2022-11-18 DIAGNOSIS — L719 Rosacea, unspecified: Secondary | ICD-10-CM | POA: Diagnosis not present

## 2022-11-18 DIAGNOSIS — L814 Other melanin hyperpigmentation: Secondary | ICD-10-CM | POA: Diagnosis not present

## 2022-11-18 DIAGNOSIS — Z1283 Encounter for screening for malignant neoplasm of skin: Secondary | ICD-10-CM | POA: Diagnosis not present

## 2022-11-18 DIAGNOSIS — D229 Melanocytic nevi, unspecified: Secondary | ICD-10-CM

## 2022-11-18 DIAGNOSIS — D225 Melanocytic nevi of trunk: Secondary | ICD-10-CM | POA: Diagnosis not present

## 2022-11-18 DIAGNOSIS — L821 Other seborrheic keratosis: Secondary | ICD-10-CM

## 2022-11-18 DIAGNOSIS — L820 Inflamed seborrheic keratosis: Secondary | ICD-10-CM

## 2022-11-18 DIAGNOSIS — L578 Other skin changes due to chronic exposure to nonionizing radiation: Secondary | ICD-10-CM

## 2022-11-18 DIAGNOSIS — D492 Neoplasm of unspecified behavior of bone, soft tissue, and skin: Secondary | ICD-10-CM

## 2022-11-18 DIAGNOSIS — Z85828 Personal history of other malignant neoplasm of skin: Secondary | ICD-10-CM

## 2022-11-18 NOTE — Patient Instructions (Addendum)
Wound Care Instructions  Cleanse wound gently with soap and water once a day then pat dry with clean gauze. Apply a thin coat of Petrolatum (petroleum jelly, "Vaseline") over the wound (unless you have an allergy to this). We recommend that you use a new, sterile tube of Vaseline. Do not pick or remove scabs. Do not remove the yellow or white "healing tissue" from the base of the wound.  Cover the wound with fresh, clean, nonstick gauze and secure with paper tape. You may use Band-Aids in place of gauze and tape if the wound is small enough, but would recommend trimming much of the tape off as there is often too much. Sometimes Band-Aids can irritate the skin.  You should call the office for your biopsy report after 1 week if you have not already been contacted.  If you experience any problems, such as abnormal amounts of bleeding, swelling, significant bruising, significant pain, or evidence of infection, please call the office immediately.  FOR ADULT SURGERY PATIENTS: If you need something for pain relief you may take 1 extra strength Tylenol (acetaminophen) AND 2 Ibuprofen ('200mg'$  each) together every 4 hours as needed for pain. (do not take these if you are allergic to them or if you have a reason you should not take them.) Typically, you may only need pain medication for 1 to 3 days.   Wound Care Instructions  Cleanse wound gently with soap and water once a day then pat dry with clean gauze. Apply a thin coat of Petrolatum (petroleum jelly, "Vaseline") over the wound (unless you have an allergy to this). We recommend that you use a new, sterile tube of Vaseline. Do not pick or remove scabs. Do not remove the yellow or white "healing tissue" from the base of the wound.  Cover the wound with fresh, clean, nonstick gauze and secure with paper tape. You may use Band-Aids in place of gauze and tape if the wound is small enough, but would recommend trimming much of the tape off as there is often too  much. Sometimes Band-Aids can irritate the skin.  You should call the office for your biopsy report after 1 week if you have not already been contacted.  If you experience any problems, such as abnormal amounts of bleeding, swelling, significant bruising, significant pain, or evidence of infection, please call the office immediately.  FOR ADULT SURGERY PATIENTS: If you need something for pain relief you may take 1 extra strength Tylenol (acetaminophen) AND 2 Ibuprofen ('200mg'$  each) together every 4 hours as needed for pain. (do not take these if you are allergic to them or if you have a reason you should not take them.) Typically, you may only need pain medication for 1 to 3 days.        Cryotherapy Aftercare  Wash gently with soap and water everyday.   Apply Vaseline and Band-Aid daily until healed.     Due to recent changes in healthcare laws, you may see results of your pathology and/or laboratory studies on MyChart before the doctors have had a chance to review them. We understand that in some cases there may be results that are confusing or concerning to you. Please understand that not all results are received at the same time and often the doctors may need to interpret multiple results in order to provide you with the best plan of care or course of treatment. Therefore, we ask that you please give [place] 2 business days to thoroughly review all your  results before contacting the office for clarification. Should we see a critical lab result, you will be contacted sooner.   If You Need Anything After Your Visit  If you have any questions or concerns for your doctor, please call our main line at [phone] and press option 4 to reach your doctor's medical assistant. If no one answers, please leave a voicemail as directed and we will return your call as soon as possible. Messages left after 4 pm will be answered the following business day.   You may also send us a message via MyChart. We  typically respond to MyChart messages within 1-2 business days.  For prescription refills, please ask your pharmacy to contact our office. Our fax number is [phone].  If you have an urgent issue when the clinic is closed that cannot wait until the next business day, you can page your doctor at the number below.    Please note that while we do our best to be available for urgent issues outside of office hours, we are not available 24/7.   If you have an urgent issue and are unable to reach us, you may choose to seek medical care at your doctor's office, retail clinic, urgent care center, or emergency room.  If you have a medical emergency, please immediately call 911 or go to the emergency department.  Pager Numbers  - Dr. Kowalski: [phone]  - Dr. Moye: [phone]  - Dr. Stewart: [phone]  In the event of inclement weather, please call our main line at [phone] for an update on the status of any delays or closures.  Dermatology Medication Tips: Please keep the boxes that topical medications come in in order to help keep track of the instructions about where and how to use these. Pharmacies typically print the medication instructions only on the boxes and not directly on the medication tubes.   If your medication is too expensive, please contact our office at [phone] option 4 or send us a message through MyChart.   We are unable to tell what your co-pay for medications will be in advance as this is different depending on your insurance coverage. However, we may be able to find a substitute medication at lower cost or fill out paperwork to get insurance to cover a needed medication.   If a prior authorization is required to get your medication covered by your insurance company, please allow us 1-2 business days to complete this process.  Drug prices often vary depending on where the prescription is filled and some pharmacies may offer cheaper prices.  The  website www.goodrx.com contains coupons for medications through different pharmacies. The prices here do not account for what the cost may be with help from insurance (it may be cheaper with your insurance), but the website can give you the price if you did not use any insurance.  - You can print the associated coupon and take it with your prescription to the pharmacy.  - You may also stop by our office during regular business hours and pick up a GoodRx coupon card.  - If you need your prescription sent electronically to a different pharmacy, notify our office through [facility] MyChart or by phone at [phone] option 4.     Si Usted Necesita Algo Despus de Su Visita  Tambin puede enviarnos un mensaje a travs de MyChart. Por lo general respondemos a los mensajes de MyChart en el transcurso de 1 a 2 das hbiles.  Para renovar recetas, por favor   pida a su farmacia que se ponga en contacto con nuestra oficina. Nuestro nmero de fax es el [phone].  Si tiene un asunto urgente cuando la clnica est cerrada y que no puede esperar hasta el siguiente da hbil, puede llamar/localizar a su doctor(a) al nmero que aparece a continuacin.   Por favor, tenga en cuenta que aunque hacemos todo lo posible para estar disponibles para asuntos urgentes fuera del horario de oficina, no estamos disponibles las 24 horas del da, los 7 das de la semana.   Si tiene un problema urgente y no puede comunicarse con nosotros, puede optar por buscar atencin mdica  en el consultorio de su doctor(a), en una clnica privada, en un centro de atencin urgente o en una sala de emergencias.  Si tiene una emergencia mdica, por favor llame inmediatamente al 911 o vaya a la sala de emergencias.  Nmeros de bper  - Dr. Kowalski: [phone]  - Dra. Moye: [phone]  - Dra. Stewart: [phone]  En caso de inclemencias del tiempo, por favor llame a nuestra lnea principal al [phone] para una  actualizacin sobre el estado de cualquier retraso o cierre.  Consejos para la medicacin en dermatologa: Por favor, guarde las cajas en las que vienen los medicamentos de uso tpico para ayudarle a seguir las instrucciones sobre dnde y cmo usarlos. Las farmacias generalmente imprimen las instrucciones del medicamento slo en las cajas y no directamente en los tubos del medicamento.   Si su medicamento es muy caro, por favor, pngase en contacto con nuestra oficina llamando al [phone] y presione la opcin 4 o envenos un mensaje a travs de MyChart.   No podemos decirle cul ser su copago por los medicamentos por adelantado ya que esto es diferente dependiendo de la cobertura de su seguro. Sin embargo, es posible que podamos encontrar un medicamento sustituto a menor costo o llenar un formulario para que el seguro cubra el medicamento que se considera necesario.   Si se requiere una autorizacin previa para que su compaa de seguros cubra su medicamento, por favor permtanos de 1 a 2 das hbiles para completar este proceso.  Los precios de los medicamentos varan con frecuencia dependiendo del lugar de dnde se surte la receta y alguna farmacias pueden ofrecer precios ms baratos.  El sitio web www.goodrx.com tiene cupones para medicamentos de diferentes farmacias. Los precios aqu no tienen en cuenta lo que podra costar con la ayuda del seguro (puede ser ms barato con su seguro), pero el sitio web puede darle el precio si no utiliz ningn seguro.  - Puede imprimir el cupn correspondiente y llevarlo con su receta a la farmacia.  - Tambin puede pasar por nuestra oficina durante el horario de atencin regular y recoger una tarjeta de cupones de GoodRx.  - Si necesita que su receta se enve electrnicamente a una farmacia diferente, informe a nuestra oficina a travs de MyChart de [facility] o por telfono llamando al [phone] y presione la opcin 4.  

## 2022-11-18 NOTE — Progress Notes (Signed)
Follow-Up Visit   Subjective  Alice Le is a 62 y.o. female who presents for the following: Annual Exam (The patient presents for Total-Body Skin Exam (TBSE) for skin cancer screening and mole check.  The patient has spots, moles and lesions to be evaluated, some may be new or changing and the patient has concerns that these could be cancer. ).  The following portions of the chart were reviewed this encounter and updated as appropriate:   Tobacco  Allergies  Meds  Problems  Med Hx  Surg Hx  Fam Hx     Review of Systems:  No other skin or systemic complaints except as noted in HPI or Assessment and Plan.  Objective  Well appearing patient in no apparent distress; mood and affect are within normal limits.  A full examination was performed including scalp, head, eyes, ears, nose, lips, neck, chest, axillae, abdomen, back, buttocks, bilateral upper extremities, bilateral lower extremities, hands, feet, fingers, toes, fingernails, and toenails. All findings within normal limits unless otherwise noted below.  left arm, left hand, face, back, left leg x  19 (19) Stuck-on, waxy, tan-brown papules  -- Discussed benign etiology and prognosis.   face Mid face erythema with telangiectasias  left superior chest 1.6 cm pink plaque          Assessment & Plan  Inflamed seborrheic keratosis (19) left arm, left hand, face, back, left leg x  19  Symptomatic, irritating, patient would like treated.   Destruction of lesion - left arm, left hand, face, back, left leg x  19 Complexity: simple   Destruction method: cryotherapy   Informed consent: discussed and consent obtained   Timeout:  patient name, date of birth, surgical site, and procedure verified Lesion destroyed using liquid nitrogen: Yes   Region frozen until ice ball extended beyond lesion: Yes   Outcome: patient tolerated procedure well with no complications   Post-procedure details: wound care instructions given     Rosacea face  Rosacea is a chronic progressive skin condition usually affecting the face of adults, causing redness and/or acne bumps. It is treatable but not curable. It sometimes affects the eyes (ocular rosacea) as well. It may respond to topical and/or systemic medication and can flare with stress, sun exposure, alcohol, exercise, topical steroids (including hydrocortisone/cortisone 10) and some foods.  Daily application of broad spectrum spf 30+ sunscreen to face is recommended to reduce flares.   Counseling for BBL / IPL / Laser and Coordination of Care Discussed the treatment option of Broad Band Light (BBL) Intense Pulsed Light (IPL) / Laser.  Typically we recommend at least 1-3 treatment sessions about 5-8 weeks apart for best results.  The patient's condition may require "maintenance treatments" in the future.  The fee for BBL / laser treatments is $350 per treatment session for the whole face.  A fee can be quoted for other parts of the body. Insurance typically does not pay for BBL/laser treatments and therefore the fee is an out-of-pocket cost.   Neoplasm of skin left superior chest  Epidermal / dermal shaving  Lesion diameter (cm):  1.6 Informed consent: discussed and consent obtained   Timeout: patient name, date of birth, surgical site, and procedure verified   Procedure prep:  Patient was prepped and draped in usual sterile fashion Prep type:  Isopropyl alcohol Anesthesia: the lesion was anesthetized in a standard fashion   Anesthetic:  1% lidocaine w/ epinephrine 1-100,000 buffered w/ 8.4% NaHCO3 Hemostasis achieved with: pressure, aluminum  chloride and electrodesiccation   Outcome: patient tolerated procedure well   Post-procedure details: sterile dressing applied and wound care instructions given   Dressing type: bandage and petrolatum    Destruction of lesion Complexity: extensive   Destruction method: electrodesiccation and curettage   Informed consent:  discussed and consent obtained   Timeout:  patient name, date of birth, surgical site, and procedure verified Procedure prep:  Patient was prepped and draped in usual sterile fashion Prep type:  Isopropyl alcohol Anesthesia: the lesion was anesthetized in a standard fashion   Anesthetic:  1% lidocaine w/ epinephrine 1-100,000 buffered w/ 8.4% NaHCO3 Curettage performed in three different directions: Yes   Electrodesiccation performed over the curetted area: Yes   Lesion length (cm):  1.6 Lesion width (cm):  1.6 Margin per side (cm):  0.2 Final wound size (cm):  2 Hemostasis achieved with:  pressure, aluminum chloride and electrodesiccation Outcome: patient tolerated procedure well with no complications   Post-procedure details: sterile dressing applied and wound care instructions given   Dressing type: bandage and petrolatum    Specimen 1 - Surgical pathology Differential Diagnosis: R/O BCC   Check Margins: No  Skin cancer screening  Actinic skin damage  Lentigo  Melanocytic nevus, unspecified location  History of basal cell carcinoma  Seborrheic keratosis   Lentigines - Scattered tan macules - Due to sun exposure - Benign-appearing, observe - Recommend daily broad spectrum sunscreen SPF 30+ to sun-exposed areas, reapply every 2 hours as needed. - Call for any changes  Seborrheic Keratoses - Stuck-on, waxy, tan-brown papules and/or plaques  - Benign-appearing - Discussed benign etiology and prognosis. - Observe - Call for any changes  Melanocytic Nevi - Tan-brown and/or pink-flesh-colored symmetric macules and papules - Benign appearing on exam today - Observation - Call clinic for new or changing moles - Recommend daily use of broad spectrum spf 30+ sunscreen to sun-exposed areas.   Hemangiomas - Red papules - Discussed benign nature - Observe - Call for any changes  Actinic Damage - Chronic condition, secondary to cumulative UV/sun exposure - diffuse  scaly erythematous macules with underlying dyspigmentation - Recommend daily broad spectrum sunscreen SPF 30+ to sun-exposed areas, reapply every 2 hours as needed.  - Staying in the shade or wearing long sleeves, sun glasses (UVA+UVB protection) and wide brim hats (4-inch brim around the entire circumference of the hat) are also recommended for sun protection.  - Call for new or changing lesions.  History of Basal Cell Carcinoma of the Skin Right mid infraclavicular 2019   - No evidence of recurrence today - Recommend regular full body skin exams - Recommend daily broad spectrum sunscreen SPF 30+ to sun-exposed areas, reapply every 2 hours as needed.  - Call if any new or changing lesions are noted between office visits   Skin cancer screening performed today.   Return in about 1 year (around 11/19/2023) for TBSE, hx of BCC.  IMarye Round, CMA, am acting as scribe for Sarina Ser, MD .  Documentation: I have reviewed the above documentation for accuracy and completeness, and I agree with the above.  Sarina Ser, MD

## 2022-11-19 ENCOUNTER — Ambulatory Visit: Payer: BLUE CROSS/BLUE SHIELD | Admitting: Neurosurgery

## 2022-11-19 ENCOUNTER — Encounter: Payer: Self-pay | Admitting: Dermatology

## 2022-11-19 VITALS — BP 141/83 | HR 76 | Temp 97.9°F | Wt 167.2 lb

## 2022-11-19 DIAGNOSIS — Z09 Encounter for follow-up examination after completed treatment for conditions other than malignant neoplasm: Secondary | ICD-10-CM

## 2022-11-19 DIAGNOSIS — M5416 Radiculopathy, lumbar region: Secondary | ICD-10-CM

## 2022-11-19 NOTE — Progress Notes (Signed)
   REFERRING PHYSICIAN:  Sofie Hartigan, Md [address] [place],  [place] [address]  DOS: 08/26/22 Left L5/S1 Far lateral discectomy and foraminotomy   HISTORY OF PRESENT ILLNESS: Alice Le is status post Left L5/S1 Far lateral discectomy and foraminotomy.  Her pain is approximately 80% improved compared to prior to surgery.     PHYSICAL EXAMINATION:  General: Patient is well developed, well nourished, calm, collected, and in no apparent distress.   NEUROLOGICAL:  General: In no acute distress.   Awake, alert, oriented to person, place, and time.  Pupils equal round and reactive to light.  Facial tone is symmetric.     Strength:            Side Iliopsoas Quads Hamstring PF DF EHL  R '5 5 5 5 5 5  '$ L '5 5 5 5 5 5   '$ Incision c/d/i   ROS (Neurologic):  Negative except as noted above  IMAGING: Nothing new to review.   ASSESSMENT/PLAN:  Alice Le was doing well s/p above surgery.  She did suffer from a post decompression flare.  That has fortunately improved over time.  I think she will continue to improve.  I will see her back as needed.   Meade Maw, MD Department of neurosurgery

## 2022-11-23 ENCOUNTER — Telehealth: Payer: Self-pay

## 2022-11-23 NOTE — Telephone Encounter (Signed)
Discussed biopsy results with patient 

## 2022-11-23 NOTE — Telephone Encounter (Signed)
-----  Message from Ralene Bathe, MD sent at 11/23/2022 11:40 AM EST ----- Diagnosis Skin , left superior chest MELANOCYTIC NEVUS, INTRADERMAL TYPE, IRRITATED  Benign irritated mole No further treatment needed

## 2022-11-26 ENCOUNTER — Other Ambulatory Visit: Payer: Self-pay | Admitting: Infectious Diseases

## 2022-11-26 DIAGNOSIS — Z1231 Encounter for screening mammogram for malignant neoplasm of breast: Secondary | ICD-10-CM

## 2023-03-15 ENCOUNTER — Encounter: Payer: Self-pay | Admitting: Neurosurgery

## 2023-03-18 ENCOUNTER — Ambulatory Visit
Admission: RE | Admit: 2023-03-18 | Discharge: 2023-03-18 | Disposition: A | Discharge status: Home / Self Care | Payer: Medicare Other | Source: Ambulatory Visit | Attending: Infectious Diseases | Admitting: Infectious Diseases

## 2023-03-18 DIAGNOSIS — Z1231 Encounter for screening mammogram for malignant neoplasm of breast: Secondary | ICD-10-CM | POA: Diagnosis present

## 2023-03-22 NOTE — Progress Notes (Unsigned)
Referring Physician:  No referring provider defined for this encounter.  Primary Physician:  Mick Sell, MD  History of Present Illness: 03/24/2023 Alice Le has a history of skin CA, FM, GERD, migraines, HTN, IBS, and OSA with CPAP.   DOS: 08/26/22 Left L5/S1 Far lateral discectomy and foraminotomy   Last seen by Dr. Myer Haff on 11/19/22 and she was doing very well.   She fell backwards off a picnic table the first weekend of April and 2 weeks later she started with constant LBP with constant bilateral leg pain laterally to her feet. She has numbness and tingling in the legs. No weakness. No specific aggravating factors. Some relief with ice/heat.    She continues on neurontin.   Bowel/Bladder Dysfunction: none  Conservative measures:  Physical therapy: none since surgery Multimodal medical therapy including regular antiinflammatories: aleve, neurontin  Injections: No epidural steroid injections since surgery  Past Surgery:  08/26/22 Left L5/S1 Far lateral discectomy and foraminotomy   Alice Le has no symptoms of cervical myelopathy.  The symptoms are causing a significant impact on the patient's life.   Review of Systems:  A 10 point review of systems is negative, except for the pertinent positives and negatives detailed in the HPI.  Past Medical History: Past Medical History:  Diagnosis Date   Anxiety    Aortic atherosclerosis (HCC)    Arthritis    Basal cell carcinoma 04/25/2018   R mid infraclavicular   Cancer (HCC)    skin ca   Cervical disc disease    Depression    Endometriosis    Fibromyalgia    GERD (gastroesophageal reflux disease)    Hyperlipidemia    Hypertension    Interstitial cystitis    Irritable bowel syndrome (IBS)    Obstructive sleep apnea on CPAP    Pneumonia 06/25/2020   Pulmonary emphysema (HCC)     Past Surgical History: Past Surgical History:  Procedure Laterality Date   ABDOMINAL HYSTERECTOMY      ABDOMINOPLASTY     CARPAL TUNNEL RELEASE Left 12/13/2017   CARPAL TUNNEL RELEASE Right 11/15/2019   CERVICAL FUSION  1974   CESAREAN SECTION  1986   CESAREAN SECTION  1988   CHOLECYSTECTOMY     COLONOSCOPY  01/08/2014   LYSIS OF ADHESION     OOPHORECTOMY     TONSILLECTOMY      Allergies: Allergies as of 03/24/2023 - Review Complete 03/24/2023  Allergen Reaction Noted   Amlodipine Swelling 07/16/2021   Hydrochlorothiazide Photosensitivity 03/19/2021    Medications: Outpatient Encounter Medications as of 03/24/2023  Medication Sig   albuterol (VENTOLIN HFA) 108 (90 Base) MCG/ACT inhaler Inhale 2 puffs into the lungs every 4 (four) hours as needed for wheezing or shortness of breath.   aspirin EC 81 MG tablet Take 81 mg by mouth daily. Swallow whole.   atorvastatin (LIPITOR) 10 MG tablet Take 10 mg by mouth at bedtime.   carvedilol (COREG) 3.125 MG tablet Take 3.125 mg by mouth 2 (two) times daily.   Cholecalciferol (VITAMIN D3) 10 MCG (400 UNIT) CAPS Take 1 capsule by mouth daily.   cloNIDine (CATAPRES) 0.1 MG tablet Take 0.1 mg by mouth 2 (two) times daily as needed.   Coenzyme Q10 (COQ10) 200 MG CAPS Take 1 capsule by mouth daily.   colestipol (COLESTID) 1 g tablet Take 1 g by mouth 2 (two) times daily.   FLONASE ALLERGY RELIEF 50 MCG/ACT nasal spray Place 2 sprays into both nostrils daily.  gabapentin (NEURONTIN) 300 MG capsule Take 3 capsules by mouth in the morning and at bedtime.   Krill Oil 1000 MG CAPS Take 1 tablet by mouth daily.   Lactobacillus-Inulin (CULTURELLE DIGESTIVE HEALTH) CAPS Take 1 capsule by mouth daily.   lisinopril (ZESTRIL) 40 MG tablet Take 40 mg by mouth daily.   Meth-Hyo-M Bl-Na Phos-Ph Sal (URIBEL) 118 MG CAPS Take 1 capsule by mouth 4 (four) times daily as needed.   RABEprazole (ACIPHEX) 20 MG tablet Take 1 tablet by mouth daily.   torsemide (DEMADEX) 5 MG tablet Take 5 mg by mouth daily.   amitriptyline (ELAVIL) 50 MG tablet Take 50 mg by mouth at  bedtime.   [DISCONTINUED] HYDROcodone-acetaminophen (NORCO) 5-325 MG tablet Take 1 tablet by mouth every 4 (four) hours as needed for moderate pain.   [DISCONTINUED] loratadine (CLARITIN) 10 MG tablet Take 10 mg by mouth daily.   No facility-administered encounter medications on file as of 03/24/2023.    Social History: Social History   Tobacco Use   Smoking status: Former    Packs/day: 0.75    Years: 40.00    Additional pack years: 0.00    Total pack years: 30.00    Types: Cigarettes, E-cigarettes    Quit date: 06/10/2020    Years since quitting: 2.7   Smokeless tobacco: Never   Tobacco comments:    Uses vape w/ nicotine  Vaping Use   Vaping Use: Every day   Substances: Nicotine   Devices: w/ nicotine  Substance Use Topics   Alcohol use: Yes    Comment: daily   Drug use: Yes    Types: Marijuana    Family Medical History: Family History  Problem Relation Age of Onset   Breast cancer Mother 80   Bladder Cancer Mother    Hypertension Mother    Thyroid disease Mother    Lung cancer Father    Heart disease Father    COPD Father     Physical Examination: Vitals:   03/24/23 1343  BP: 122/74      Awake, alert, oriented to person, place, and time.  Speech is clear and fluent. Fund of knowledge is appropriate.   Cranial Nerves: Pupils equal round and reactive to light.  Facial tone is symmetric.    Well healed lumbar incision. Mild central lower lumbar tenderness.   No abnormal lesions on exposed skin.   Strength: Side Biceps Triceps Deltoid Interossei Grip Wrist Ext. Wrist Flex.  R 5 5 5 5 5 5 5   L 5 5 5 5 5 5 5    Side Iliopsoas Quads Hamstring PF DF EHL  R 5 5 5 5 5 5   L 5 5 5 5 5 5    Reflexes are 2+ and symmetric at the biceps, triceps, brachioradialis, patella and achilles.   Hoffman's is absent.  Clonus is not present.   Bilateral upper and lower extremity sensation is intact to light touch.     Gait is normal.     Medical Decision  Making  Imaging: none  Assessment and Plan: Alice Le is a pleasant 62 y.o. female is s/p left L5-S1 far lateral discectomy and foraminotomy on 08/26/22. She was doing well until recently.  She fell backwards off a picnic table the first weekend of April and 2 weeks later she started with constant LBP with constant bilateral leg pain laterally to her feet. She has numbness and tingling in the legs. No weakness. Symptoms have waxed and waned.   No recent lumbar  imaging. LBP and leg pain appears to be lumbar mediated.   Treatment options discussed with patient and following plan made:   - Medrol dose pack for symptom relief. Reviewed dosing and side effects. She has taken previously. Stop the aleve.  - Continue on neurontin.  - If no improvement with dose pack, the will get updated MRI of lumbar spine. Will message her next Friday to check on her.   I spent a total of 15 minutes in face-to-face and non-face-to-face activities related to this patient's care today including review of outside records, review of imaging, review of symptoms, physical exam, discussion of differential diagnosis, discussion of treatment options, and documentation.   Drake Leach PA-C Dept. of Neurosurgery

## 2023-03-24 ENCOUNTER — Encounter: Payer: Self-pay | Admitting: Orthopedic Surgery

## 2023-03-24 ENCOUNTER — Ambulatory Visit: Payer: Medicare Other | Admitting: Orthopedic Surgery

## 2023-03-24 VITALS — BP 122/74 | Ht 62.0 in | Wt 164.0 lb

## 2023-03-24 DIAGNOSIS — Z9889 Other specified postprocedural states: Secondary | ICD-10-CM

## 2023-03-24 DIAGNOSIS — M5441 Lumbago with sciatica, right side: Secondary | ICD-10-CM

## 2023-03-24 DIAGNOSIS — W08XXXA Fall from other furniture, initial encounter: Secondary | ICD-10-CM

## 2023-03-24 DIAGNOSIS — M5442 Lumbago with sciatica, left side: Secondary | ICD-10-CM | POA: Diagnosis not present

## 2023-03-24 DIAGNOSIS — M5416 Radiculopathy, lumbar region: Secondary | ICD-10-CM

## 2023-03-24 MED ORDER — METHYLPREDNISOLONE 4 MG PO TBPK: ORAL_TABLET | ORAL | 0 refills | Status: DC

## 2023-04-19 ENCOUNTER — Ambulatory Visit: Payer: Medicare Other

## 2023-04-21 ENCOUNTER — Ambulatory Visit: Admission: RE | Admit: 2023-04-21 | Payer: Medicare Other | Source: Ambulatory Visit

## 2023-04-22 ENCOUNTER — Ambulatory Visit
Admission: RE | Admit: 2023-04-22 | Discharge: 2023-04-22 | Disposition: A | Discharge status: Home / Self Care | Payer: Medicare Other | Source: Ambulatory Visit | Attending: Acute Care | Admitting: Acute Care

## 2023-04-22 DIAGNOSIS — Z87891 Personal history of nicotine dependence: Secondary | ICD-10-CM | POA: Diagnosis present

## 2023-04-22 DIAGNOSIS — Z122 Encounter for screening for malignant neoplasm of respiratory organs: Secondary | ICD-10-CM | POA: Diagnosis present

## 2023-04-29 ENCOUNTER — Other Ambulatory Visit: Payer: Self-pay

## 2023-04-29 DIAGNOSIS — Z87891 Personal history of nicotine dependence: Secondary | ICD-10-CM

## 2023-04-29 DIAGNOSIS — Z122 Encounter for screening for malignant neoplasm of respiratory organs: Secondary | ICD-10-CM

## 2023-07-19 ENCOUNTER — Ambulatory Visit: Payer: Medicare Other | Admitting: Urology

## 2023-07-19 ENCOUNTER — Encounter: Payer: Self-pay | Admitting: Urology

## 2023-07-19 VITALS — BP 131/84 | HR 64 | Ht 62.0 in | Wt 165.0 lb

## 2023-07-19 DIAGNOSIS — N3010 Interstitial cystitis (chronic) without hematuria: Secondary | ICD-10-CM

## 2023-07-19 DIAGNOSIS — N39498 Other specified urinary incontinence: Secondary | ICD-10-CM | POA: Diagnosis not present

## 2023-07-19 DIAGNOSIS — N3020 Other chronic cystitis without hematuria: Secondary | ICD-10-CM

## 2023-07-19 LAB — URINALYSIS, COMPLETE
Bilirubin, UA: NEGATIVE
Glucose, UA: NEGATIVE
Ketones, UA: NEGATIVE
Nitrite, UA: NEGATIVE
Protein,UA: NEGATIVE
RBC, UA: NEGATIVE
Specific Gravity, UA: <1.005 — ABNORMAL LOW (ref 1.005–1.030)
Urobilinogen, Ur: 0.2 mg/dL (ref 0.2–1.0)
pH, UA: 6 (ref 5.0–7.5)

## 2023-07-19 LAB — MICROSCOPIC EXAMINATION

## 2023-07-19 MED ORDER — URIBEL 118 MG PO CAPS: 118.0000 mg | ORAL_CAPSULE | Freq: Two times a day (BID) | ORAL | 3 refills | Status: DC | PRN

## 2023-07-19 NOTE — Addendum Note (Signed)
Addended by: Levada Schilling on: 07/19/2023 04:32 PM   Modules accepted: Orders

## 2023-07-19 NOTE — Progress Notes (Signed)
07/19/2023 2:31 PM   Alice Le [birth year] [id number]  Referring provider: Mick Sell, MD [address] [place],  [place] [address]  Chief Complaint  Patient presents with   Medication Refill    HPI: I was consulted to assist the patient for interstitial cystitis.  She was followed by Dr. Sheppard Penton.  About 6 times a year she will take Uribel twice a day for abdominal and back pain.  She will have increased frequency at the same time  At baseline she voids every 1 or 2 hours gets up once at night.  She has little bit of postvoid dribbling but otherwise is continent  She has had low back and neck surgery.  She had a hysterectomy  She is having issues with the formulary of her Uribel apparently  No history of kidney stones bladder surgery or bladder infections   PMH: Past Medical History:  Diagnosis Date   Anxiety    Aortic atherosclerosis (HCC)    Arthritis    Basal cell carcinoma 04/25/2018   R mid infraclavicular   Cancer (HCC)    skin ca   Cervical disc disease    Depression    Endometriosis    Fibromyalgia    GERD (gastroesophageal reflux disease)    Hyperlipidemia    Hypertension    Interstitial cystitis    Irritable bowel syndrome (IBS)    Obstructive sleep apnea on CPAP    Pneumonia 06/25/2020   Pulmonary emphysema (HCC)     Surgical History: Past Surgical History:  Procedure Laterality Date   ABDOMINAL HYSTERECTOMY     ABDOMINOPLASTY     CARPAL TUNNEL RELEASE Left 12/13/2017   CARPAL TUNNEL RELEASE Right 11/15/2019   CERVICAL FUSION  1974   CESAREAN SECTION  1986   CESAREAN SECTION  1988   CHOLECYSTECTOMY     COLONOSCOPY  01/08/2014   LYSIS OF ADHESION     OOPHORECTOMY     TONSILLECTOMY      Home Medications:  Allergies as of 07/19/2023       Reactions   Amlodipine Swelling   Leg edema   Hydrochlorothiazide Photosensitivity        Medication List        Accurate as of July 19, 2023  2:31 PM. If you have any  questions, ask your nurse or doctor.          albuterol 108 (90 Base) MCG/ACT inhaler Commonly known as: VENTOLIN HFA Inhale 2 puffs into the lungs every 4 (four) hours as needed for wheezing or shortness of breath.   amitriptyline 50 MG tablet Commonly known as: ELAVIL Take 50 mg by mouth at bedtime.   aspirin EC 81 MG tablet Take 81 mg by mouth daily. Swallow whole.   atorvastatin 10 MG tablet Commonly known as: LIPITOR Take 10 mg by mouth at bedtime.   carvedilol 3.125 MG tablet Commonly known as: COREG Take 3.125 mg by mouth 2 (two) times daily.   cloNIDine 0.1 MG tablet Commonly known as: CATAPRES Take 0.1 mg by mouth 2 (two) times daily as needed.   colestipol 1 g tablet Commonly known as: COLESTID Take 1 g by mouth 2 (two) times daily.   CoQ10 200 MG Caps Take 1 capsule by mouth daily.   Culturelle Digestive Health Caps Take 1 capsule by mouth daily.   Flonase Allergy Relief 50 MCG/ACT nasal spray Generic drug: fluticasone Place 2 sprays into both nostrils daily.   gabapentin 300 MG capsule Commonly known  as: NEURONTIN Take 3 capsules by mouth in the morning and at bedtime.   Krill Oil 1000 MG Caps Take 1 tablet by mouth daily.   lisinopril 40 MG tablet Commonly known as: ZESTRIL Take 40 mg by mouth daily.   methylPREDNISolone 4 MG Tbpk tablet Commonly known as: MEDROL DOSEPAK Use as directed x 6 days. Stop aleve when taking dose pack.   RABEprazole 20 MG tablet Commonly known as: ACIPHEX Take 1 tablet by mouth daily.   torsemide 5 MG tablet Commonly known as: DEMADEX Take 5 mg by mouth daily.   Uribel 118 MG Caps Take 1 capsule by mouth 4 (four) times daily as needed.   Vitamin D3 10 MCG (400 UNIT) Caps Take 1 capsule by mouth daily.        Allergies:  Allergies  Allergen Reactions   Amlodipine Swelling    Leg edema   Hydrochlorothiazide Photosensitivity    Family History: Family History  Problem Relation Age of Onset    Breast cancer Mother 19   Bladder Cancer Mother    Hypertension Mother    Thyroid disease Mother    Lung cancer Father    Heart disease Father    COPD Father     Social History:  reports that she quit smoking about 3 years ago. Her smoking use included cigarettes and e-cigarettes. She started smoking about 43 years ago. She has a 30 pack-year smoking history. She has never used smokeless tobacco. She reports current alcohol use. She reports current drug use. Drug: Marijuana.  ROS:                                        Physical Exam: BP 131/84   Pulse 64   Ht 5\' 2"  (1.575 m)   Wt 74.8 kg   BMI 30.18 kg/m   Constitutional:  Alert and oriented, No acute distress. HEENT: [place] AT, moist mucus membranes.  Trachea midline, no masses.  Laboratory Data: Lab Results  Component Value Date   WBC 4.6 08/17/2022   HGB 12.1 08/17/2022   HCT 36.0 08/17/2022   MCV 102.0 (H) 08/17/2022   PLT 230 08/17/2022    Lab Results  Component Value Date   CREATININE 0.73 08/17/2022    No results found for: "PSA"  No results found for: "TESTOSTERONE"  No results found for: "HGBA1C"  Urinalysis    Component Value Date/Time   COLORURINE YELLOW (A) 08/17/2022 1125   APPEARANCEUR CLEAR (A) 08/17/2022 1125   LABSPEC 1.010 08/17/2022 1125   PHURINE 6.0 08/17/2022 1125   GLUCOSEU NEGATIVE 08/17/2022 1125   HGBUR NEGATIVE 08/17/2022 1125   BILIRUBINUR NEGATIVE 08/17/2022 1125   KETONESUR NEGATIVE 08/17/2022 1125   PROTEINUR NEGATIVE 08/17/2022 1125   NITRITE NEGATIVE 08/17/2022 1125   LEUKOCYTESUR SMALL (A) 08/17/2022 1125    Pertinent Imaging: Urine reviewed  Assessment & Plan: Patient has interstitial cystitis well-controlled. 30 tablets of Uribel with 3 refills sent to pharmacy and reassess 1 year  There are no diagnoses linked to this encounter.  No follow-ups on file.  Martina Sinner, MD  [facility] [address] [place], [place] [address] [phone]

## 2023-09-29 ENCOUNTER — Other Ambulatory Visit: Payer: Self-pay | Admitting: Physical Medicine & Rehabilitation

## 2023-09-29 DIAGNOSIS — M5442 Lumbago with sciatica, left side: Secondary | ICD-10-CM

## 2023-10-04 ENCOUNTER — Inpatient Hospital Stay
Admission: RE | Admit: 2023-10-04 | Discharge: 2023-10-04 | Discharge status: Home / Self Care | Payer: Medicare Other | Source: Ambulatory Visit | Attending: Physical Medicine & Rehabilitation | Admitting: Physical Medicine & Rehabilitation

## 2023-10-04 DIAGNOSIS — M5442 Lumbago with sciatica, left side: Secondary | ICD-10-CM

## 2023-10-19 ENCOUNTER — Other Ambulatory Visit: Payer: Medicare Other

## 2024-01-01 NOTE — Progress Notes (Unsigned)
 Referring Physician:  Elijah Birk, MD [address] [place],  [place] [address]  Primary Physician:  Mick Sell, MD  History of Present Illness: [name] has a history of skin CA, FM, GERD, migraines, HTN, IBS, and OSA with CPAP.   DOS: 08/26/22 Left L5/S1 Far lateral discectomy and foraminotomy   Last seen by me on 03/24/23 for constant LBP and constant bilateral leg pain after falling off picnic table in April. Appears her pain slowly improved and she was lost to follow up.   She has been seeing PMR Mariah Milling) and she had updated lumbar MRI.   She had bilateral L5-S1 TF ESI on 11/19/23 with improvement in right sided pain. She had bilateral S1 TF ESI on 12/10/23 with minimal improvement.   She is here for follow up.  She has constant LBP with constant left lateral leg pain to her foot. She has intermittent left groin pain as well. She has minimal intermittent right posterior leg pain that is tolerable. Pain is worse with sitting, standing, and sweeping. Pain is better with nothing. She has numbness and tingling in left foot. She has weakness in left leg.   She continues on neurontin.   Scheduled to start PT on 01/10/24.   She continues to vape.   Bowel/Bladder Dysfunction: none  Conservative measures:  Physical therapy: appt scheduled on 01/10/24 with [facility]  Multimodal medical therapy including regular antiinflammatories: aleve, neurontin, zanaflex Injections:  Bilateral S1 TF ESI 12/10/23 Bilateral L5-S1 TF ESI on 11/19/23  Past Surgery:  08/26/22 Left L5/S1 Far lateral discectomy and foraminotomy   Alice Le has no symptoms of cervical myelopathy.  The symptoms are causing a significant impact on the patient's life.   Review of Systems:  A 10 point review of systems is negative, except for the pertinent positives and negatives detailed in the HPI.  Past Medical History: Past Medical History:  Diagnosis Date   Anxiety    Aortic  atherosclerosis (HCC)    Arthritis    Basal cell carcinoma 04/25/2018   R mid infraclavicular   Cancer (HCC)    skin ca   Cervical disc disease    Depression    Endometriosis    Fibromyalgia    GERD (gastroesophageal reflux disease)    Hyperlipidemia    Hypertension    Interstitial cystitis    Irritable bowel syndrome (IBS)    Obstructive sleep apnea on CPAP    Pneumonia 06/25/2020   Pulmonary emphysema (HCC)     Past Surgical History: Past Surgical History:  Procedure Laterality Date   ABDOMINAL HYSTERECTOMY     ABDOMINOPLASTY     CARPAL TUNNEL RELEASE Left 12/13/2017   CARPAL TUNNEL RELEASE Right 11/15/2019   CERVICAL FUSION  1974   CESAREAN SECTION  1986   CESAREAN SECTION  1988   CHOLECYSTECTOMY     COLONOSCOPY  01/08/2014   LYSIS OF ADHESION     OOPHORECTOMY     TONSILLECTOMY      Allergies: Allergies as of 01/04/2024 - Review Complete 07/19/2023  Allergen Reaction Noted   Amlodipine Swelling 07/16/2021   Hydrochlorothiazide Photosensitivity 03/19/2021    Medications: Outpatient Encounter Medications as of 01/04/2024  Medication Sig   albuterol (VENTOLIN HFA) 108 (90 Base) MCG/ACT inhaler Inhale 2 puffs into the lungs every 4 (four) hours as needed for wheezing or shortness of breath.   amitriptyline (ELAVIL) 50 MG tablet Take 50 mg by mouth at bedtime.   aspirin EC 81 MG  tablet Take 81 mg by mouth daily. Swallow whole.   atorvastatin (LIPITOR) 10 MG tablet Take 10 mg by mouth at bedtime.   carvedilol (COREG) 3.125 MG tablet Take 3.125 mg by mouth 2 (two) times daily.   Cholecalciferol (VITAMIN D3) 10 MCG (400 UNIT) CAPS Take 1 capsule by mouth daily.   cloNIDine (CATAPRES) 0.1 MG tablet Take 0.1 mg by mouth 2 (two) times daily as needed.   Coenzyme Q10 (COQ10) 200 MG CAPS Take 1 capsule by mouth daily.   colestipol (COLESTID) 1 g tablet Take 1 g by mouth 2 (two) times daily.   FLONASE ALLERGY RELIEF 50 MCG/ACT nasal spray Place 2 sprays into both nostrils  daily.   gabapentin (NEURONTIN) 300 MG capsule Take 3 capsules by mouth in the morning and at bedtime.   Krill Oil 1000 MG CAPS Take 1 tablet by mouth daily.   Lactobacillus-Inulin (CULTURELLE DIGESTIVE HEALTH) CAPS Take 1 capsule by mouth daily.   lisinopril (ZESTRIL) 40 MG tablet Take 40 mg by mouth daily.   Meth-Hyo-M Bl-Na Phos-Ph Sal (URIBEL) 118 MG CAPS Take 1 capsule by mouth 4 (four) times daily as needed.   Meth-Hyo-M Bl-Na Phos-Ph Sal (URIBEL) 118 MG CAPS Take 1 capsule (118 mg total) by mouth 2 (two) times daily as needed.   methylPREDNISolone (MEDROL DOSEPAK) 4 MG TBPK tablet Use as directed x 6 days. Stop aleve when taking dose pack.   RABEprazole (ACIPHEX) 20 MG tablet Take 1 tablet by mouth daily.   torsemide (DEMADEX) 5 MG tablet Take 5 mg by mouth daily.   No facility-administered encounter medications on file as of 01/04/2024.    Social History: Social History   Tobacco Use   Smoking status: Former    Current packs/day: 0.00    Average packs/day: 0.8 packs/day for 40.0 years (30.0 ttl pk-yrs)    Types: Cigarettes, E-cigarettes    Start date: 06/10/1980    Quit date: 06/10/2020    Years since quitting: 3.5   Smokeless tobacco: Never   Tobacco comments:    Uses vape w/ nicotine  Vaping Use   Vaping status: Every Day   Substances: Nicotine   Devices: w/ nicotine  Substance Use Topics   Alcohol use: Yes    Comment: daily   Drug use: Yes    Types: Marijuana    Family Medical History: Family History  Problem Relation Age of Onset   Breast cancer Mother 42   Bladder Cancer Mother    Hypertension Mother    Thyroid disease Mother    Lung cancer Father    Heart disease Father    COPD Father     Physical Examination: There were no vitals filed for this visit.    Awake, alert, oriented to person, place, and time.  Speech is clear and fluent. Fund of knowledge is appropriate.   Cranial Nerves: Pupils equal round and reactive to light.  Facial tone is symmetric.     Well healed lumbar incision. Mild lower lumbar tenderness.   No abnormal lesions on exposed skin.   Strength:  Side Iliopsoas Quads Hamstring PF DF EHL  R 5 5 5 5 5 5   L 5 5 5 5 5 5    Reflexes are 2+ and symmetric at the patella and achilles.    Clonus is not present.   Bilateral lower extremity sensation is intact to light touch.     She has left groin pain with IR/ER of left hip. No pain with IR/ER of her  right hip.   Gait is normal.     Medical Decision Making  Imaging: Lumbar MRI dated 10/04/23:  FINDINGS: Segmentation:  Standard.   Alignment:  Physiologic.   Vertebrae: No acute fracture, evidence of discitis, or aggressive bone lesion.   Conus medullaris and cauda equina: Conus extends to the L1-2 level. Conus and cauda equina appear normal.   Paraspinal and other soft tissues: No acute paraspinal abnormality.   Disc levels:   Disc spaces: Mild disc height loss at T11-12, L2-3, and L3-4.   T11-12: Mild disc bulge.  No foraminal or central canal stenosis.   T12-L1: No significant disc bulge. No neural foraminal stenosis. No central canal stenosis.   L1-L2: No significant disc bulge. Mild bilateral facet arthropathy. No foraminal or central canal stenosis.   L2-L3: Mild broad-based disc bulge. Mild bilateral facet arthropathy. No foraminal or central canal stenosis.   L3-L4: Mild broad-based disc bulge. Moderate bilateral facet arthropathy. No foraminal or central canal stenosis.   L4-L5: Mild disc bulge. Mild bilateral facet arthropathy. Moderate left foraminal stenosis. Mild-moderate right foraminal stenosis. No central canal stenosis.   L5-S1: Mild disc bulge with a prominent left lateral disc component in close proximity to the left extraforaminal L5 nerve root. Severe left and moderate right facet arthropathy. Moderate bilateral foraminal narrowing, left greater than right. No central canal stenosis.   IMPRESSION: 1. At L5-S1 there is a  mild disc bulge with a prominent left lateral disc component in close proximity to the left extraforaminal L5 nerve root. Moderate bilateral foraminal narrowing, left greater than right. 2. At L4-5 there is a mild disc bulge. Mild bilateral facet arthropathy. Moderate left foraminal stenosis. Mild-moderate right foraminal stenosis. 3. No acute osseous injury of the lumbar spine.     Electronically Signed   By: Elige Ko M.D.   On: 10/20/2023 12:46   I have personally reviewed the images and agree with the above interpretation.   Assessment and Plan: Ms. Kuc is a pleasant 63 y.o. female is s/p left L5-S1 far lateral discectomy and foraminotomy on 08/26/22. She did great after surgery, but pain has been worse since last fall.   She has constant LBP with constant left lateral leg pain to her foot. She has intermittent left groin pain as well. She has minimal intermittent right posterior leg pain that is tolerable. She has numbness and tingling in left foot. She has weakness in left leg.   She has moderate left and mild/moderate right foraminal stenosis L4-L5 with left lateral disc at L5-S1 that likely abuts left L5 nerve along with moderate bilateral foraminal stenosis.   Treatment options discussed with patient and following plan made:   - Agree with PT for lumbar spine scheduled to start 01/10/23.  - Continue on neurontin.  - If no improvement with PT, will need to see Dr. Myer Haff to discuss possible surgery options.  - Will message her in 4 weeks to check on progress with PT.   I spent a total of 15 minutes in face-to-face and non-face-to-face activities related to this patient's care today including review of outside records, review of imaging, review of symptoms, physical exam, discussion of differential diagnosis, discussion of treatment options, and documentation.   Drake Leach PA-C Dept. of Neurosurgery

## 2024-01-04 ENCOUNTER — Encounter: Payer: Self-pay | Admitting: Orthopedic Surgery

## 2024-01-04 ENCOUNTER — Ambulatory Visit: Payer: Medicare Other | Admitting: Orthopedic Surgery

## 2024-01-04 VITALS — BP 136/80 | Ht 62.0 in | Wt 165.0 lb

## 2024-01-04 DIAGNOSIS — Z9889 Other specified postprocedural states: Secondary | ICD-10-CM | POA: Diagnosis not present

## 2024-01-04 DIAGNOSIS — M47816 Spondylosis without myelopathy or radiculopathy, lumbar region: Secondary | ICD-10-CM

## 2024-01-04 DIAGNOSIS — M4726 Other spondylosis with radiculopathy, lumbar region: Secondary | ICD-10-CM | POA: Diagnosis not present

## 2024-01-04 DIAGNOSIS — M5126 Other intervertebral disc displacement, lumbar region: Secondary | ICD-10-CM | POA: Diagnosis not present

## 2024-01-04 DIAGNOSIS — M5416 Radiculopathy, lumbar region: Secondary | ICD-10-CM

## 2024-01-04 DIAGNOSIS — M48061 Spinal stenosis, lumbar region without neurogenic claudication: Secondary | ICD-10-CM

## 2024-02-03 ENCOUNTER — Other Ambulatory Visit: Payer: Self-pay | Admitting: Infectious Diseases

## 2024-02-03 DIAGNOSIS — Z1231 Encounter for screening mammogram for malignant neoplasm of breast: Secondary | ICD-10-CM

## 2024-03-10 NOTE — Progress Notes (Unsigned)
 Referring Physician:  Eartha Gold, MD [address] [place],  [place] [address]  Primary Physician:  Eartha Gold, MD  History of Present Illness: [name] has a history of skin CA, FM, GERD, migraines, HTN, IBS, and OSA with CPAP.   DOS: 08/26/22 Left L5/S1 Far lateral discectomy and foraminotomy   Last seen by me on 01/04/24 for LBP with left leg pain. She has moderate left and mild/moderate right foraminal stenosis L4-L5 with left lateral disc at L5-S1 that likely abuts left L5 nerve along with moderate bilateral foraminal stenosis.   She had initial eval for lumbar PT on 01/10/23 and has done 9 visits through 03/10/24. She thinks PT made her worse.   She is here for follow up.   She continues with constant LBP with intermittent left lateral leg pain to her foot. She has intermittent left groin pain as well. No right leg pain. Pain is worse with sitting, standing, and sweeping. Pain is better with nothing. No numbness or tingling. She has weakness in both legs.   She continues on neurontin. She is on cymbalta and thinks it may be helping.   She continues to vape with nicotine.   Bowel/Bladder Dysfunction: some urinary urgency, no bowel/bladder incontinence  Conservative measures:  Physical therapy: initial eval for lumbar PT on 01/10/23 and has done 9 visits through 03/10/24 Multimodal medical therapy including regular antiinflammatories: aleve, neurontin, zanaflex Injections:  Bilateral S1 TF ESI 12/10/23 Bilateral L5-S1 TF ESI on 11/19/23  Past Surgery:  08/26/22 Left L5/S1 Far lateral discectomy and foraminotomy   Alice Le has no symptoms of cervical myelopathy.  The symptoms are causing a significant impact on the patient's life.   Review of Systems:  A 10 point review of systems is negative, except for the pertinent positives and negatives detailed in the HPI.  Past Medical History: Past Medical History:  Diagnosis Date   Anxiety     Aortic atherosclerosis (HCC)    Arthritis    Basal cell carcinoma 04/25/2018   R mid infraclavicular   Cancer (HCC)    skin ca   Cervical disc disease    Depression    Endometriosis    Fibromyalgia    GERD (gastroesophageal reflux disease)    Hyperlipidemia    Hypertension    Interstitial cystitis    Irritable bowel syndrome (IBS)    Obstructive sleep apnea on CPAP    Pneumonia 06/25/2020   Pulmonary emphysema (HCC)     Past Surgical History: Past Surgical History:  Procedure Laterality Date   ABDOMINAL HYSTERECTOMY     ABDOMINOPLASTY     CARPAL TUNNEL RELEASE Left 12/13/2017   CARPAL TUNNEL RELEASE Right 11/15/2019   CERVICAL FUSION  1974   CESAREAN SECTION  1986   CESAREAN SECTION  1988   CHOLECYSTECTOMY     COLONOSCOPY  01/08/2014   LYSIS OF ADHESION     OOPHORECTOMY     TONSILLECTOMY      Allergies: Allergies as of 03/15/2024 - Review Complete 01/04/2024  Allergen Reaction Noted   Amlodipine Swelling 07/16/2021   Hydrochlorothiazide Photosensitivity 03/19/2021    Medications: Outpatient Encounter Medications as of 03/15/2024  Medication Sig   albuterol  (VENTOLIN  HFA) 108 (90 Base) MCG/ACT inhaler Inhale 2 puffs into the lungs every 4 (four) hours as needed for wheezing or shortness of breath.   amitriptyline (ELAVIL) 50 MG tablet Take 50 mg by mouth at bedtime.   aspirin EC 81 MG tablet Take 81  mg by mouth daily. Swallow whole.   atorvastatin (LIPITOR) 10 MG tablet Take 10 mg by mouth at bedtime.   carvedilol (COREG) 3.125 MG tablet Take 3.125 mg by mouth 2 (two) times daily.   Cholecalciferol (VITAMIN D3) 10 MCG (400 UNIT) CAPS Take 1 capsule by mouth daily.   cloNIDine (CATAPRES) 0.1 MG tablet Take 0.1 mg by mouth 2 (two) times daily as needed.   Coenzyme Q10 (COQ10) 200 MG CAPS Take 1 capsule by mouth daily.   colestipol (COLESTID) 1 g tablet Take 1 g by mouth 2 (two) times daily.   DULoxetine (CYMBALTA) 20 MG capsule Take 20 mg by mouth daily.   FLONASE  ALLERGY RELIEF 50 MCG/ACT nasal spray Place 2 sprays into both nostrils daily.   gabapentin (NEURONTIN) 300 MG capsule Take 3 capsules by mouth in the morning and at bedtime.   Krill Oil 1000 MG CAPS Take 1 tablet by mouth daily.   Lactobacillus-Inulin (CULTURELLE DIGESTIVE HEALTH) CAPS Take 1 capsule by mouth daily.   lisinopril  (ZESTRIL ) 40 MG tablet Take 40 mg by mouth daily.   Meth-Hyo-M Bl-Na Phos-Ph Sal (URIBEL ) 118 MG CAPS Take 1 capsule (118 mg total) by mouth 2 (two) times daily as needed.   RABEprazole (ACIPHEX) 20 MG tablet Take 1 tablet by mouth daily.   torsemide (DEMADEX) 5 MG tablet Take 5 mg by mouth daily.   No facility-administered encounter medications on file as of 03/15/2024.    Social History: Social History   Tobacco Use   Smoking status: Former    Current packs/day: 0.00    Average packs/day: 0.8 packs/day for 40.0 years (30.0 ttl pk-yrs)    Types: Cigarettes, E-cigarettes    Start date: 06/10/1980    Quit date: 06/10/2020    Years since quitting: 3.7   Smokeless tobacco: Never   Tobacco comments:    Uses vape w/ nicotine  Vaping Use   Vaping status: Every Day   Substances: Nicotine   Devices: w/ nicotine  Substance Use Topics   Alcohol use: Yes    Comment: daily   Drug use: Yes    Types: Marijuana    Family Medical History: Family History  Problem Relation Age of Onset   Breast cancer Mother 49   Bladder Cancer Mother    Hypertension Mother    Thyroid disease Mother    Lung cancer Father    Heart disease Father    COPD Father     Physical Examination: There were no vitals filed for this visit.    Awake, alert, oriented to person, place, and time.  Speech is clear and fluent. Fund of knowledge is appropriate.   Cranial Nerves: Pupils equal round and reactive to light.  Facial tone is symmetric.    Well healed lumbar incision. Mild lower lumbar tenderness.   No abnormal lesions on exposed skin.   Strength:  Side Iliopsoas Quads Hamstring  PF DF EHL  R 5 5 5 5 5 5   L 5 5 5 5 5 5    Reflexes are 2+ and symmetric at the patella and achilles.    Clonus is not present.   Bilateral lower extremity sensation is intact to light touch.     She has left groin pain with IR/ER of left hip. No pain with IR/ER of her right hip.   Gait is normal.     Medical Decision Making  Imaging: none  Assessment and Plan: Ms. Neyens continues with constant LBP with intermittent left lateral leg  pain to her foot. She has intermittent left groin pain as well. No right leg pain. No numbness or tingling. She has weakness in both legs.  She has moderate left and mild/moderate right foraminal stenosis L4-L5 with left lateral disc at L5-S1 that likely abuts left L5 nerve along with moderate bilateral foraminal stenosis.   She thinks PT made her worse. No improvement with injections. She has seen some relief with limiting her activity at home.   Treatment options discussed with patient and following plan made:   - She feels her current pain is tolerable.  - She is not interested in any further lumbar surgery.  - She will follow up prn.  - If pain gets worse, will have her see Dr. Mont Antis to discuss possible surgery options.   BP was slightly elevated. No symptoms of chest pain, shortness of breath, blurry vision, or headaches. She declined recheck at end of visit.   I spent a total of 15 minutes in face-to-face and non-face-to-face activities related to this patient's care today including review of outside records, review of imaging, review of symptoms, physical exam, discussion of differential diagnosis, discussion of treatment options, and documentation.   Lucetta Russel PA-C Dept. of Neurosurgery

## 2024-03-15 ENCOUNTER — Ambulatory Visit: Admitting: Orthopedic Surgery

## 2024-03-15 ENCOUNTER — Encounter: Payer: Self-pay | Admitting: Orthopedic Surgery

## 2024-03-15 VITALS — BP 140/88 | Ht 62.0 in | Wt 165.0 lb

## 2024-03-15 DIAGNOSIS — M5126 Other intervertebral disc displacement, lumbar region: Secondary | ICD-10-CM

## 2024-03-15 DIAGNOSIS — M4726 Other spondylosis with radiculopathy, lumbar region: Secondary | ICD-10-CM | POA: Diagnosis not present

## 2024-03-15 DIAGNOSIS — M48061 Spinal stenosis, lumbar region without neurogenic claudication: Secondary | ICD-10-CM

## 2024-03-15 DIAGNOSIS — M5416 Radiculopathy, lumbar region: Secondary | ICD-10-CM

## 2024-03-15 DIAGNOSIS — Z9889 Other specified postprocedural states: Secondary | ICD-10-CM

## 2024-03-15 DIAGNOSIS — M47816 Spondylosis without myelopathy or radiculopathy, lumbar region: Secondary | ICD-10-CM

## 2024-03-20 ENCOUNTER — Ambulatory Visit: Payer: Self-pay

## 2024-03-20 DIAGNOSIS — K2970 Gastritis, unspecified, without bleeding: Secondary | ICD-10-CM | POA: Diagnosis not present

## 2024-03-20 DIAGNOSIS — K642 Third degree hemorrhoids: Secondary | ICD-10-CM | POA: Diagnosis not present

## 2024-03-20 DIAGNOSIS — D123 Benign neoplasm of transverse colon: Secondary | ICD-10-CM | POA: Diagnosis present

## 2024-03-20 DIAGNOSIS — K2289 Other specified disease of esophagus: Secondary | ICD-10-CM | POA: Diagnosis not present

## 2024-03-22 ENCOUNTER — Ambulatory Visit

## 2024-03-22 ENCOUNTER — Ambulatory Visit
Admission: RE | Admit: 2024-03-22 | Discharge: 2024-03-22 | Disposition: A | Discharge status: Home / Self Care | Source: Ambulatory Visit | Attending: Infectious Diseases | Admitting: Infectious Diseases

## 2024-03-22 DIAGNOSIS — Z1231 Encounter for screening mammogram for malignant neoplasm of breast: Secondary | ICD-10-CM | POA: Diagnosis present

## 2024-04-24 ENCOUNTER — Ambulatory Visit
Admission: RE | Admit: 2024-04-24 | Discharge: 2024-04-24 | Disposition: A | Discharge status: Home / Self Care | Source: Ambulatory Visit | Attending: Acute Care | Admitting: Acute Care

## 2024-04-24 DIAGNOSIS — Z87891 Personal history of nicotine dependence: Secondary | ICD-10-CM | POA: Insufficient documentation

## 2024-04-24 DIAGNOSIS — Z122 Encounter for screening for malignant neoplasm of respiratory organs: Secondary | ICD-10-CM | POA: Diagnosis present

## 2024-05-08 ENCOUNTER — Other Ambulatory Visit: Payer: Self-pay | Admitting: Acute Care

## 2024-05-08 DIAGNOSIS — Z87891 Personal history of nicotine dependence: Secondary | ICD-10-CM

## 2024-05-08 DIAGNOSIS — Z122 Encounter for screening for malignant neoplasm of respiratory organs: Secondary | ICD-10-CM

## 2024-06-01 NOTE — Telephone Encounter (Signed)
 Please call her to find out more about symptoms. I have in my notes she was having some urinary urgency, not incontinence.   Let me know, but if she is having new leg weakness and new incontinence then she needs to go to ED.

## 2024-06-01 NOTE — Telephone Encounter (Signed)
 Spoke with patient. She has urinary urgency, no incontinence. No bowel issues. No perineal numbness.   No new weakness in her legs- when she does any kind of increased activity, she has to rest for a few days.   Appt scheduled with Dr. Clois.

## 2024-06-12 NOTE — Progress Notes (Unsigned)
 Referring Physician:  Epifanio Alm SQUIBB, MD [address] [place],  [place] [address]  Primary Physician:  Alice Alm SQUIBB, MD  History of Present Illness: 06/15/2024 [name] is here today with a chief complaint of recurrent left radicular pain.  She had a far lateral discectomy 2 years ago with an excellent response.  More recently, she began having left leg pain again.  She has been doing physical therapy and pursued a course of more than 2 months.  She now presents to discuss options.  03/15/2024 Note from Alice Boys, PA-C [name] has a history of skin CA, FM, GERD, migraines, HTN, IBS, and OSA with CPAP.    DOS: 08/26/22 Left L5/S1 Far lateral discectomy and foraminotomy    Last seen by me on 01/04/24 for LBP with left leg pain. She has moderate left and mild/moderate right foraminal stenosis L4-L5 with left lateral disc at L5-S1 that likely abuts left L5 nerve along with moderate bilateral foraminal stenosis.    She had initial eval for lumbar PT on 01/10/23 and has done 9 visits through 03/10/24. She thinks PT made her worse.    She is here for follow up.    She continues with constant LBP with intermittent left lateral leg pain to her foot. She has intermittent left groin pain as well. No right leg pain. Pain is worse with sitting, standing, and sweeping. Pain is better with nothing. No numbness or tingling. She has weakness in both legs.    She continues on neurontin. She is on cymbalta and thinks it may be helping.    She continues to vape with nicotine.    Bowel/Bladder Dysfunction: some urinary urgency, no bowel/bladder incontinence   Conservative measures:  Physical therapy: initial eval for lumbar PT on 01/10/23 and has done 9 visits through 03/10/24 Multimodal medical therapy including regular antiinflammatories: aleve, neurontin, zanaflex Injections:  Bilateral S1 TF ESI 12/10/23 Bilateral L5-S1 TF ESI on 11/19/23   Past Surgery:  08/26/22  Left L5/S1 Far lateral discectomy and foraminotomy   Review of Systems:  A 10 point review of systems is negative, except for the pertinent positives and negatives detailed in the HPI.  Past Medical History: Past Medical History:  Diagnosis Date   Anxiety    Aortic atherosclerosis (HCC)    Arthritis    Basal cell carcinoma 04/25/2018   R mid infraclavicular   Cancer (HCC)    skin ca   Cervical disc disease    Depression    Endometriosis    Fibromyalgia    GERD (gastroesophageal reflux disease)    Hyperlipidemia    Hypertension    Interstitial cystitis    Irritable bowel syndrome (IBS)    Obstructive sleep apnea on CPAP    Pneumonia 06/25/2020   Pulmonary emphysema (HCC)     Past Surgical History: Past Surgical History:  Procedure Laterality Date   ABDOMINAL HYSTERECTOMY     ABDOMINOPLASTY     CARPAL TUNNEL RELEASE Left 12/13/2017   CARPAL TUNNEL RELEASE Right 11/15/2019   CERVICAL FUSION  1974   CESAREAN SECTION  1986   CESAREAN SECTION  1988   CHOLECYSTECTOMY     COLONOSCOPY  01/08/2014   LYSIS OF ADHESION     OOPHORECTOMY     TONSILLECTOMY      Allergies: Allergies as of 06/15/2024 - Review Complete 06/15/2024  Allergen Reaction Noted   Amlodipine Swelling 07/16/2021   Hydrochlorothiazide Photosensitivity 03/19/2021    Medications:  Current  Outpatient Medications:    amitriptyline (ELAVIL) 50 MG tablet, Take 50 mg by mouth at bedtime., Disp: , Rfl:    aspirin EC 81 MG tablet, Take 81 mg by mouth daily. Swallow whole., Disp: , Rfl:    atorvastatin (LIPITOR) 10 MG tablet, Take 10 mg by mouth at bedtime., Disp: , Rfl:    carvedilol (COREG) 3.125 MG tablet, Take 3.125 mg by mouth 2 (two) times daily., Disp: , Rfl:    Cholecalciferol (VITAMIN D3) 10 MCG (400 UNIT) CAPS, Take 1 capsule by mouth daily., Disp: , Rfl:    cloNIDine (CATAPRES) 0.1 MG tablet, Take 0.1 mg by mouth 2 (two) times daily as needed., Disp: , Rfl:    Coenzyme Q10 (COQ10) 200 MG CAPS, Take  1 capsule by mouth daily., Disp: , Rfl:    colestipol (COLESTID) 1 g tablet, Take 1 g by mouth 2 (two) times daily., Disp: , Rfl:    DULoxetine (CYMBALTA) 60 MG capsule, Take 60 mg by mouth daily., Disp: , Rfl:    FLONASE ALLERGY RELIEF 50 MCG/ACT nasal spray, Place 2 sprays into both nostrils daily., Disp: , Rfl:    gabapentin (NEURONTIN) 300 MG capsule, Take 3 capsules by mouth in the morning and at bedtime., Disp: , Rfl:    Krill Oil 1000 MG CAPS, Take 1 tablet by mouth daily., Disp: , Rfl:    Lactobacillus-Inulin (CULTURELLE DIGESTIVE HEALTH) CAPS, Take 1 capsule by mouth daily., Disp: , Rfl:    lisinopril  (ZESTRIL ) 40 MG tablet, Take 40 mg by mouth daily., Disp: , Rfl:    Meth-Hyo-M Bl-Na Phos-Ph Sal (URIBEL ) 118 MG CAPS, Take 1 capsule (118 mg total) by mouth 2 (two) times daily as needed., Disp: 30 capsule, Rfl: 3   RABEprazole (ACIPHEX) 20 MG tablet, Take 1 tablet by mouth daily., Disp: , Rfl:    torsemide (DEMADEX) 5 MG tablet, Take 5 mg by mouth daily., Disp: , Rfl:   Social History: Social History   Tobacco Use   Smoking status: Former    Current packs/day: 0.00    Average packs/day: 0.8 packs/day for 40.0 years (30.0 ttl pk-yrs)    Types: Cigarettes, E-cigarettes    Start date: 06/10/1980    Quit date: 06/10/2020    Years since quitting: 4.0   Smokeless tobacco: Never   Tobacco comments:    Uses vape w/ nicotine  Vaping Use   Vaping status: Every Day   Substances: Nicotine   Devices: w/ nicotine  Substance Use Topics   Alcohol use: Yes    Comment: daily   Drug use: Yes    Types: Marijuana    Family Medical History: Family History  Problem Relation Age of Onset   Breast cancer Mother 53   Bladder Cancer Mother    Hypertension Mother    Thyroid disease Mother    Lung cancer Father    Heart disease Father    COPD Father     Physical Examination: Vitals:   06/15/24 1007  BP: 128/70    General: Patient is in no apparent distress. Attention to examination is  appropriate.  Neck:   Supple.  Full range of motion.  Respiratory: Patient is breathing without any difficulty.   NEUROLOGICAL:     Awake, alert, oriented to person, place, and time.  Speech is clear and fluent.   Cranial Nerves: Pupils equal round and reactive to light.  Facial tone is symmetric.  Facial sensation is symmetric. Shoulder shrug is symmetric. Tongue protrusion is midline.  There is no pronator drift.  Strength: Side Biceps Triceps Deltoid Interossei Grip Wrist Ext. Wrist Flex.  R 5 5 5 5 5 5 5   L 5 5 5 5 5 5 5    Side Iliopsoas Quads Hamstring PF DF EHL  R 5 5 5 5 5 5   L 5 5 5 5 5 5    Reflexes are 1+ and symmetric at the biceps, triceps, brachioradialis, patella and achilles.   Hoffman's is absent.   Bilateral upper and lower extremity sensation is intact to light touch.    No evidence of dysmetria noted.  Gait is antalgic.  Straight leg raise is positive on the left at 45 degrees.     Medical Decision Making  Imaging: \MRI L spine 10/04/2023  IMPRESSION: 1. At L5-S1 there is a mild disc bulge with a prominent left lateral disc component in close proximity to the left extraforaminal L5 nerve root. Moderate bilateral foraminal narrowing, left greater than right. 2. At L4-5 there is a mild disc bulge. Mild bilateral facet arthropathy. Moderate left foraminal stenosis. Mild-moderate right foraminal stenosis. 3. No acute osseous injury of the lumbar spine.     Electronically Signed   By: Julaine Blanch M.D.   On: 10/20/2023 12:46  I have personally reviewed the images and agree with the above interpretation.  Assessment and Plan: Ms. Koppenhaver is a pleasant 63 y.o. 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  She has tried and failed conservative management.  No further conservative management is indicated.  I have recommended a left-sided  L5-S1 transforaminal lumbar interbody fusion.  I do not think we do far lateral discectomy is safe as it would have prohibitive risk of nerve root injury given the scarring present from the prior surgery.  When she has quit using nicotine, she will let me know and we will send appropriate testing.  I spent a total of 30 minutes in this patient's care today. This time was spent reviewing pertinent records including imaging studies, obtaining and confirming history, performing a directed evaluation, formulating and discussing my recommendations, and documenting the visit within the medical record.      Thank you for involving me in the care of this patient.      Serenna Deroy K. Clois MD, [facility] Neurosurgery

## 2024-06-15 ENCOUNTER — Encounter: Payer: Self-pay | Admitting: Neurosurgery

## 2024-06-15 ENCOUNTER — Ambulatory Visit: Admitting: Neurosurgery

## 2024-06-15 VITALS — BP 128/70 | Ht 62.0 in | Wt 162.5 lb

## 2024-06-15 DIAGNOSIS — Z9889 Other specified postprocedural states: Secondary | ICD-10-CM | POA: Diagnosis not present

## 2024-06-15 DIAGNOSIS — M5117 Intervertebral disc disorders with radiculopathy, lumbosacral region: Secondary | ICD-10-CM | POA: Diagnosis not present

## 2024-06-15 DIAGNOSIS — M5416 Radiculopathy, lumbar region: Secondary | ICD-10-CM

## 2024-06-15 DIAGNOSIS — M5126 Other intervertebral disc displacement, lumbar region: Secondary | ICD-10-CM

## 2024-07-17 ENCOUNTER — Ambulatory Visit: Payer: Self-pay | Admitting: Urology

## 2024-09-28 ENCOUNTER — Other Ambulatory Visit: Payer: Self-pay

## 2024-10-02 ENCOUNTER — Ambulatory Visit: Admitting: Urology

## 2024-10-02 VITALS — BP 137/72 | HR 56 | Ht 62.0 in | Wt 161.0 lb

## 2024-10-02 DIAGNOSIS — N39498 Other specified urinary incontinence: Secondary | ICD-10-CM | POA: Diagnosis not present

## 2024-10-02 DIAGNOSIS — N3020 Other chronic cystitis without hematuria: Secondary | ICD-10-CM

## 2024-10-02 LAB — URINALYSIS, COMPLETE
Bilirubin, UA: NEGATIVE
Glucose, UA: NEGATIVE
Ketones, UA: NEGATIVE
Nitrite, UA: NEGATIVE
Protein,UA: NEGATIVE
RBC, UA: NEGATIVE
Specific Gravity, UA: 1.02 (ref 1.005–1.030)
Urobilinogen, Ur: 0.2 mg/dL (ref 0.2–1.0)
pH, UA: 6 (ref 5.0–7.5)

## 2024-10-02 LAB — MICROSCOPIC EXAMINATION

## 2024-10-02 MED ORDER — URIBEL 118 MG PO CAPS: 1.0000 | ORAL_CAPSULE | Freq: Two times a day (BID) | ORAL | 2 refills | Status: AC | PRN

## 2024-10-02 NOTE — Progress Notes (Signed)
 10/02/2024 10:44 AM   Alice Le [birth year] [id number]  Referring provider: Epifanio Alm SQUIBB, MD [address] [place],  [place] [address]  No chief complaint on file.   HPI: I was consulted to assist the patient for interstitial cystitis.  She was followed by Dr. Gala.  About 6 times a year she will take Uribel  twice a day for abdominal and back pain.  She will have increased frequency at the same time   At baseline she voids every 1 or 2 hours gets up once at night.  She has little bit of postvoid dribbling but otherwise is continent   She has had low back and neck surgery.  She had a hysterectomy   She is having issues with the formulary of her Uribel  apparently    Patient has interstitial cystitis well-controlled. 30 tablets of Uribel  with 3 refills sent to pharmacy and reassess 1 year   Today Approximately every 6 to 8 weeks she can use Uribel  for a couple of days and her symptoms settled.  She is actually done very well with her interstitial cystitis.  No infections.  Frequency stable   PMH: Past Medical History:  Diagnosis Date   Anxiety    Aortic atherosclerosis    Arthritis    Basal cell carcinoma 04/25/2018   R mid infraclavicular   Cancer (HCC)    skin ca   Cervical disc disease    Depression    Endometriosis    Fibromyalgia    GERD (gastroesophageal reflux disease)    Hyperlipidemia    Hypertension    Interstitial cystitis    Irritable bowel syndrome (IBS)    Obstructive sleep apnea on CPAP    Pneumonia 06/25/2020   Pulmonary emphysema (HCC)     Surgical History: Past Surgical History:  Procedure Laterality Date   ABDOMINAL HYSTERECTOMY     ABDOMINOPLASTY     CARPAL TUNNEL RELEASE Left 12/13/2017   CARPAL TUNNEL RELEASE Right 11/15/2019   CERVICAL FUSION  1974   CESAREAN SECTION  1986   CESAREAN SECTION  1988   CHOLECYSTECTOMY     COLONOSCOPY  01/08/2014   LYSIS OF ADHESION     OOPHORECTOMY     TONSILLECTOMY      Home  Medications:  Allergies as of 10/02/2024       Reactions   Amlodipine Swelling   Leg edema   Hydrochlorothiazide Photosensitivity        Medication List        Accurate as of October 02, 2024 10:44 AM. If you have any questions, ask your nurse or doctor.          amitriptyline 50 MG tablet Commonly known as: ELAVIL Take 50 mg by mouth at bedtime.   aspirin EC 81 MG tablet Take 81 mg by mouth daily. Swallow whole.   atorvastatin 10 MG tablet Commonly known as: LIPITOR Take 10 mg by mouth at bedtime.   carvedilol 3.125 MG tablet Commonly known as: COREG Take 3.125 mg by mouth 2 (two) times daily.   cloNIDine 0.1 MG tablet Commonly known as: CATAPRES Take 0.1 mg by mouth 2 (two) times daily as needed.   colestipol 1 g tablet Commonly known as: COLESTID Take 1 g by mouth 2 (two) times daily.   CoQ10 200 MG Caps Take 1 capsule by mouth daily.   Culturelle Digestive Health Caps Take 1 capsule by mouth daily.   DULoxetine 60 MG capsule Commonly known as: CYMBALTA Take 60 mg  by mouth daily.   Flonase Allergy Relief 50 MCG/ACT nasal spray Generic drug: fluticasone Place 2 sprays into both nostrils daily.   gabapentin 300 MG capsule Commonly known as: NEURONTIN Take 3 capsules by mouth in the morning and at bedtime.   Krill Oil 1000 MG Caps Take 1 tablet by mouth daily.   lisinopril  40 MG tablet Commonly known as: ZESTRIL  Take 40 mg by mouth daily.   RABEprazole 20 MG tablet Commonly known as: ACIPHEX Take 1 tablet by mouth daily.   torsemide 5 MG tablet Commonly known as: DEMADEX Take 5 mg by mouth daily.   Vitamin D3 10 MCG (400 UNIT) Caps Take 1 capsule by mouth daily.        Allergies:  Allergies  Allergen Reactions   Amlodipine Swelling    Leg edema   Hydrochlorothiazide Photosensitivity    Family History: Family History  Problem Relation Age of Onset   Breast cancer Mother 50   Bladder Cancer Mother    Hypertension Mother     Thyroid disease Mother    Lung cancer Father    Heart disease Father    COPD Father     Social History:  reports that she quit smoking about 4 years ago. Her smoking use included cigarettes and e-cigarettes. She started smoking about 44 years ago. She has a 30 pack-year smoking history. She has never used smokeless tobacco. She reports current alcohol use. She reports current drug use. Drug: Marijuana.  ROS:                                        Physical Exam: There were no vitals taken for this visit.  Constitutional:  Alert and oriented, No acute distress. HEENT: [place] AT, moist mucus membranes.  Trachea midline, no masses.   Laboratory Data: Lab Results  Component Value Date   WBC 4.6 08/17/2022   HGB 12.1 08/17/2022   HCT 36.0 08/17/2022   MCV 102.0 (H) 08/17/2022   PLT 230 08/17/2022    Lab Results  Component Value Date   CREATININE 0.73 08/17/2022    No results found for: PSA  No results found for: TESTOSTERONE  No results found for: HGBA1C  Urinalysis    Component Value Date/Time   COLORURINE YELLOW (A) 08/17/2022 1125   APPEARANCEUR Clear 07/19/2023 1437   LABSPEC 1.010 08/17/2022 1125   PHURINE 6.0 08/17/2022 1125   GLUCOSEU Negative 07/19/2023 1437   HGBUR NEGATIVE 08/17/2022 1125   BILIRUBINUR Negative 07/19/2023 1437   KETONESUR NEGATIVE 08/17/2022 1125   PROTEINUR Negative 07/19/2023 1437   PROTEINUR NEGATIVE 08/17/2022 1125   NITRITE Negative 07/19/2023 1437   NITRITE NEGATIVE 08/17/2022 1125   LEUKOCYTESUR Trace (A) 07/19/2023 1437   LEUKOCYTESUR SMALL (A) 08/17/2022 1125    Pertinent Imaging:   Assessment & Plan: I sent in Uribel  30 tablets with 2 refills and I will see her in a year.  1. Chronic cystitis (Primary)  - Urinalysis, Complete  2. Other urinary incontinence  - Urinalysis, Complete   No follow-ups on file.  Alice DELENA Elizabeth, MD  [facility] [address] [place], [place] [address] [phone]

## 2024-10-02 NOTE — Addendum Note (Signed)
 Addended byBETHA CORIE PLATER on: 10/02/2024 11:23 AM   Modules accepted: Orders

## 2025-10-01 ENCOUNTER — Ambulatory Visit: Admitting: Urology
